# Patient Record
Sex: Female | Born: 1995 | Race: White | Hispanic: No | Marital: Single | Smoking: Current every day smoker
Health system: Southern US, Community
[De-identification: ages and names within clinical notes are randomized; demographics above are authoritative.]

## PROBLEM LIST (undated history)

## (undated) DIAGNOSIS — L309 Dermatitis, unspecified: Secondary | ICD-10-CM

## (undated) DIAGNOSIS — F32A Depression, unspecified: Secondary | ICD-10-CM

## (undated) DIAGNOSIS — F988 Other specified behavioral and emotional disorders with onset usually occurring in childhood and adolescence: Secondary | ICD-10-CM

## (undated) DIAGNOSIS — Z6828 Body mass index (BMI) 28.0-28.9, adult: Secondary | ICD-10-CM

## (undated) DIAGNOSIS — K219 Gastro-esophageal reflux disease without esophagitis: Secondary | ICD-10-CM

## (undated) DIAGNOSIS — T753XXA Motion sickness, initial encounter: Secondary | ICD-10-CM

## (undated) DIAGNOSIS — J45909 Unspecified asthma, uncomplicated: Secondary | ICD-10-CM

## (undated) DIAGNOSIS — F419 Anxiety disorder, unspecified: Secondary | ICD-10-CM

## (undated) HISTORY — PX: NO PAST SURGERIES: SHX2092

## (undated) HISTORY — DX: Depression, unspecified: F32.A

## (undated) HISTORY — DX: Body mass index (BMI) 28.0-28.9, adult: Z68.28

## (undated) HISTORY — PX: TONSILLECTOMY: SUR1361

---
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
## 2017-01-01 ENCOUNTER — Emergency Department
Admission: EM | Admit: 2017-01-01 | Discharge: 2017-01-01 | Disposition: A | Discharge status: Home / Self Care | Payer: Medicaid Other | Attending: Emergency Medicine | Admitting: Emergency Medicine

## 2017-01-01 ENCOUNTER — Encounter: Payer: Self-pay | Admitting: Intensive Care

## 2017-01-01 DIAGNOSIS — F17210 Nicotine dependence, cigarettes, uncomplicated: Secondary | ICD-10-CM | POA: Diagnosis not present

## 2017-01-01 DIAGNOSIS — N3000 Acute cystitis without hematuria: Secondary | ICD-10-CM | POA: Diagnosis not present

## 2017-01-01 DIAGNOSIS — J45909 Unspecified asthma, uncomplicated: Secondary | ICD-10-CM | POA: Diagnosis not present

## 2017-01-01 DIAGNOSIS — F909 Attention-deficit hyperactivity disorder, unspecified type: Secondary | ICD-10-CM | POA: Insufficient documentation

## 2017-01-01 DIAGNOSIS — Z79899 Other long term (current) drug therapy: Secondary | ICD-10-CM | POA: Insufficient documentation

## 2017-01-01 DIAGNOSIS — R350 Frequency of micturition: Secondary | ICD-10-CM | POA: Diagnosis present

## 2017-01-01 DIAGNOSIS — Z91040 Latex allergy status: Secondary | ICD-10-CM | POA: Diagnosis not present

## 2017-01-01 LAB — URINALYSIS, COMPLETE (UACMP) WITH MICROSCOPIC
BILIRUBIN URINE: NEGATIVE
Bacteria, UA: NONE SEEN
Glucose, UA: NEGATIVE mg/dL
HGB URINE DIPSTICK: NEGATIVE
KETONES UR: NEGATIVE mg/dL
Leukocytes, UA: NEGATIVE
Nitrite: NEGATIVE
PROTEIN: NEGATIVE mg/dL
Specific Gravity, Urine: 1.02 (ref 1.005–1.030)
pH: 6 (ref 5.0–8.0)

## 2017-01-01 LAB — POCT PREGNANCY, URINE: PREG TEST UR: NEGATIVE

## 2017-01-01 MED ORDER — CEPHALEXIN 500 MG PO CAPS: 500.0000 mg | ORAL_CAPSULE | Freq: Three times a day (TID) | ORAL | 0 refills | Status: AC

## 2017-01-01 NOTE — ED Triage Notes (Signed)
Patient c/o lower back pain and frequent urination

## 2017-01-01 NOTE — ED Provider Notes (Signed)
[facility] Emergency Department Provider Note  ____________________________________________  Time seen: Approximately 4:47 PM  I have reviewed the triage vital signs and the nursing notes.   HISTORY  Chief Complaint Urinary Tract Infection    HPI Cindy Matthews is a 21 y.o. female presents to the emergency department with increased urinary frequency and bilateral low back pain for approximately 3 days.  Patient reports that she had a urinary tract infection 2 years ago that developed into pyelonephritis.  Patient reports that she did not have dysuria during prior cystitis.  She denies abdominal pain, nausea and hematuria.  No fever.  No alleviating measures have been attempted.   Past Medical History:  Diagnosis Date  . ADD (attention deficit disorder)    No meds since 2016  . Anxiety   . Asthma   . Eczema   . GERD (gastroesophageal reflux disease)   . Motion sickness    [address]    Patient Active Problem List   Diagnosis Date Noted  . Anxiety 01/30/2016  . History of recurrent UTIs 11/13/2014  . ADD (attention deficit disorder) 05/25/2014  . Dermatitis, eczematoid 05/25/2014  . Acid reflux 05/25/2014  . H/O herpes labialis 05/25/2014    Past Surgical History:  Procedure Laterality Date  . NO PAST SURGERIES    . NO PAST SURGERIES    . TONSILLECTOMY AND ADENOIDECTOMY N/A 03/21/2016   Procedure: TONSILLECTOMY AND ADENOIDECTOMY;  Surgeon: Bud Face, MD;  Location: [facility];  Service: ENT;  Laterality: N/A;  Latex sensitivity  adenoids cauterized no tissue sent    Prior to Admission medications   Medication Sig Start Date End Date Taking? Authorizing Provider  busPIRone (BUSPAR) 15 MG tablet Take 1 tablet (15 mg total) by mouth 2 (two) times daily. 07/10/16   Anola Gurney, PA  cephALEXin (KEFLEX) 500 MG capsule Take 1 capsule (500 mg total) by mouth 3 (three) times daily for 10 days. 01/01/17 01/11/17  Orvil Feil,  PA-C  cetirizine (ZYRTEC) 10 MG tablet Take 1 tablet (10 mg total) by mouth daily. 03/02/16   Anola Gurney, PA  PROAIR HFA 108 [phone] Base) MCG/ACT inhaler inhale 2 puffs by mouth every 6 hours if needed for wheezing 04/12/15   Malva Limes, MD  ranitidine (ZANTAC) 75 MG tablet Take 75 mg by mouth 2 (two) times daily as needed for heartburn.    [provider]  triamcinolone cream (KENALOG) 0.1 % Apply 1 application topically 2 (two) times daily. Patient not taking: Reported on 07/10/2016 03/02/16   Anola Gurney, PA    Allergies Cat hair extract; Dog fennel allergy skin test; Dust mite extract; Nitrofurantoin; and Latex  Family History  Problem Relation Age of Onset  . Hypertension Maternal Grandmother   . Diabetes Maternal Grandfather   . Thyroid disease Paternal Grandmother     Social History Social History   Tobacco Use  . Smoking status: Current Some Day Smoker    Packs/day: 0.50    Types: E-cigarettes, Cigarettes  . Smokeless tobacco: Former Neurosurgeon  . Tobacco comment: Smoked 1/2 PPD for about 1 yr. Quit about 1 mo ago.  Now "vapes"  Substance Use Topics  . Alcohol use: No    Alcohol/week: 0.0 oz  . Drug use: Yes    Types: Marijuana     Review of Systems  Constitutional: No fever/chills Eyes: No visual changes. No discharge ENT: No upper respiratory complaints. Cardiovascular: no chest pain. Respiratory: no cough. No SOB. Gastrointestinal:  No abdominal pain.  No nausea, no vomiting.  No diarrhea.  No constipation. Genitourinary: Patient has increased urinary freuqncy. No hematuria Musculoskeletal: Patient has low back pain.  Skin: Negative for rash, abrasions, lacerations, ecchymosis. Neurological: Negative for headaches, focal weakness or numbness.   ____________________________________________   PHYSICAL EXAM:  VITAL SIGNS: ED Triage Vitals  Enc Vitals Group     BP 01/01/17 1540 109/65     Pulse Rate 01/01/17 1540 91     Resp 01/01/17 1540 16      Temp 01/01/17 1540 98.1 F (36.7 C)     Temp Source 01/01/17 1540 Oral     SpO2 01/01/17 1540 98 %     Weight 01/01/17 1540 188 lb (85.3 kg)     Height 01/01/17 1540 5\' 7"  (1.702 m)     Head Circumference --      Peak Flow --      Pain Score 01/01/17 1544 5     Pain Loc --      Pain Edu? --      Excl. in GC? --      Constitutional: Alert and oriented. Well appearing and in no acute distress. Eyes: Conjunctivae are normal. PERRL. EOMI. Head: Atraumatic. Cardiovascular: Normal rate, regular rhythm. Normal S1 and S2.  Good peripheral circulation. Respiratory: Normal respiratory effort without tachypnea or retractions. Lungs CTAB. Good air entry to the bases with no decreased or absent breath sounds. Gastrointestinal: Bowel sounds 4 quadrants. Soft and nontender to palpation. No guarding or rigidity. No palpable masses. No distention. No CVA tenderness. Musculoskeletal: Full range of motion to all extremities. No gross deformities appreciated. Neurologic:  Normal speech and language. No gross focal neurologic deficits are appreciated.  Skin:  Skin is warm, dry and intact. No rash noted. Psychiatric: Mood and affect are normal. Speech and behavior are normal. Patient exhibits appropriate insight and judgement.   ____________________________________________   LABS (all labs ordered are listed, but only abnormal results are displayed)  Labs Reviewed  URINALYSIS, COMPLETE (UACMP) WITH MICROSCOPIC - Abnormal; Notable for the following components:      Result Value   Color, Urine YELLOW (*)    APPearance CLEAR (*)    Squamous Epithelial / LPF 0-5 (*)    All other components within normal limits  URINE CULTURE  POC URINE PREG, ED  POCT PREGNANCY, URINE   ____________________________________________  EKG   ____________________________________________  RADIOLOGY   No results found.  ____________________________________________    PROCEDURES  Procedure(s)  performed:    Procedures    Medications - No data to display   ____________________________________________   INITIAL IMPRESSION / ASSESSMENT AND PLAN / ED COURSE  Pertinent labs & imaging results that were available during my care of the patient were reviewed by me and considered in my medical decision making (see chart for details).  Review of the [place] CSRS was performed in accordance of the [facility] prior to dispensing any controlled drugs.     Assessment and plan Acute cystitis Patient presents to the emergency department with increased urinary frequency and bilateral low back pain for approximately 3 days.  Urinalysis was noncontributory for acute cystitis.  Urine culture was ordered and patient was treated empirically with Keflex.  She was advised to return to the emergency department for new or worsening symptoms.  Vital signs are reassuring prior to discharge.  All patient questions were answered.    ____________________________________________  FINAL CLINICAL IMPRESSION(S) / ED DIAGNOSES  Final diagnoses:  Acute cystitis without  hematuria      NEW MEDICATIONS STARTED DURING THIS VISIT:  ED Discharge Orders        Ordered    cephALEXin (KEFLEX) 500 MG capsule  3 times daily     01/01/17 1643          This chart was dictated using voice recognition software/Dragon. Despite best efforts to proofread, errors can occur which can change the meaning. Any change was purely unintentional.    Orvil FeilWoods, Jaclyn M, PA-C 01/01/17 1650    Minna AntisPaduchowski, Kevin, MD 01/01/17 2330

## 2017-01-03 LAB — URINE CULTURE: Culture: NO GROWTH

## 2017-05-14 ENCOUNTER — Encounter: Payer: Self-pay | Admitting: Family Medicine

## 2017-05-14 ENCOUNTER — Telehealth: Payer: Self-pay | Admitting: Family Medicine

## 2017-05-14 ENCOUNTER — Ambulatory Visit (INDEPENDENT_AMBULATORY_CARE_PROVIDER_SITE_OTHER): Payer: Medicaid Other | Admitting: Family Medicine

## 2017-05-14 ENCOUNTER — Other Ambulatory Visit: Payer: Self-pay | Admitting: Family Medicine

## 2017-05-14 VITALS — BP 112/72 | HR 105 | Temp 98.0°F | Resp 14 | Ht 67.5 in | Wt 170.8 lb

## 2017-05-14 DIAGNOSIS — F4322 Adjustment disorder with anxiety: Secondary | ICD-10-CM | POA: Diagnosis not present

## 2017-05-14 DIAGNOSIS — Z113 Encounter for screening for infections with a predominantly sexual mode of transmission: Secondary | ICD-10-CM

## 2017-05-14 LAB — POCT URINE PREGNANCY: PREG TEST UR: NEGATIVE

## 2017-05-14 MED ORDER — BUSPIRONE HCL 15 MG PO TABS: ORAL_TABLET | ORAL | 1 refills | Status: DC

## 2017-05-14 NOTE — Progress Notes (Signed)
  Subjective:     Patient ID: Cindy Matthews, female   DOB: 09-02-1995, 22 y.o.   MRN: [id number] Chief Complaint  Patient presents with  . Contraception    wants to start the pill, she states is having long heavy periods with blood clots  . SEXUALLY TRANSMITTED DISEASE    wants to be checked, did have a green discharged, with odor   HPI States she broke up with her latest boyfriend 2 weeks ago": My period came sooner than usual (4/22) and is lasting longer." Reports she has douched on one occasion. Intercourse has been unprotected. Reports anxiety and wishes to get back on medication again. Last responded to buspirone.  Review of Systems     Objective:   Physical Exam  Constitutional: She appears well-developed and well-nourished. No distress.       Assessment:    1. Screen for STD (sexually transmitted disease) - POCT urine pregnancy - Chlamydia/Gonococcus/Trichomonas, NAA - HIV antibody - RPR  2. Adjustment disorder with anxious mood - busPIRone (BUSPAR) 15 MG tablet; Start at 1/2 pill twice daily. If not helping after a week may go up to a whole pill twice daily  Dispense: 30 tablet; Refill: 1    Plan:    Further f/u pending lab results and in two weeks

## 2017-05-14 NOTE — Telephone Encounter (Signed)
Pharmacy notified.

## 2017-05-14 NOTE — Patient Instructions (Signed)
We will call you with the lab results. 

## 2017-05-14 NOTE — Telephone Encounter (Signed)
I did review her pharmacy while she was here, but at the time she did not know which one she wanted to use?

## 2017-05-14 NOTE — Telephone Encounter (Signed)
Call and cancel the buspirone rx at [facility]

## 2017-05-14 NOTE — Telephone Encounter (Signed)
Pt stated when she was in for her OV this morning she forgot to change her pharmacy to [facility]. Pt is requesting that the Rx for busPIRone (BUSPAR) 15 MG tablet be sent to [facility]. I have updated pt's preferred pharmacy. Please advise. Thanks TNP

## 2017-05-14 NOTE — Telephone Encounter (Signed)
Pt also notified about orders for blood draw

## 2017-05-15 ENCOUNTER — Telehealth: Payer: Self-pay | Admitting: Family Medicine

## 2017-05-15 NOTE — Telephone Encounter (Signed)
Not my patient and I have never seen her.

## 2017-05-15 NOTE — Telephone Encounter (Signed)
Pt was in yesterday and thought a prescription for birth control was going to be sent in  She said she had to get a pregnancy test before it was sent in and she did the test right before she left  Pt's call back is [phone]  [facility].  [

## 2017-05-15 NOTE — Telephone Encounter (Signed)
Patient seen the other day in office by you please review her chart and advise. KW

## 2017-05-16 LAB — CHLAMYDIA/GONOCOCCUS/TRICHOMONAS, NAA
CHLAMYDIA BY NAA: POSITIVE — AB
GONOCOCCUS BY NAA: NEGATIVE
TRICH VAG BY NAA: NEGATIVE

## 2017-05-16 NOTE — Telephone Encounter (Signed)
We were working on evaluating her vaginal discharge first.

## 2017-05-16 NOTE — Telephone Encounter (Signed)
Left message with mother for patient to return call. KW

## 2017-05-16 NOTE — Telephone Encounter (Signed)
Patient states that vaginal discharge wasn't a big issue for her because she can not tell if she is having a discharge because of the vaginal bleeding. Patient states that she has been on her monthly cycle for the past 2 weeks and was hoping to start birthcontrol to stop bleeding ( urine HCG is negative). Please advise. KW

## 2017-05-16 NOTE — Telephone Encounter (Signed)
Will need to review her STD screen first which should be available in the next 24 hours.

## 2017-05-17 ENCOUNTER — Telehealth: Payer: Self-pay

## 2017-05-17 ENCOUNTER — Other Ambulatory Visit: Payer: Self-pay | Admitting: Family Medicine

## 2017-05-17 MED ORDER — NORETHINDRONE ACET-ETHINYL EST 1-20 MG-MCG PO TABS
1.0000 | ORAL_TABLET | Freq: Every day | ORAL | 11 refills | Status: DC
Start: 2017-05-17 — End: 2019-03-10

## 2017-05-17 MED ORDER — AZITHROMYCIN 500 MG PO TABS: ORAL_TABLET | ORAL | 0 refills | Status: DC

## 2017-05-17 NOTE — Telephone Encounter (Signed)
[id number] 

## 2017-05-17 NOTE — Telephone Encounter (Signed)
-----   Message from Anola Gurney, [place] sent at 05/17/2017  7:59 AM EDT ----- You have an STD. I have sent in a one time dose of medication to your pharmacy as well as prescription for birth control pills. Partner needs to be treated as well. Health Department will be notified (please fill out notification card)

## 2017-05-17 NOTE — Telephone Encounter (Signed)
Patient advised, form being faxed to health department. KW

## 2017-05-23 ENCOUNTER — Ambulatory Visit: Payer: Medicaid Other | Admitting: Family Medicine

## 2017-05-27 ENCOUNTER — Ambulatory Visit: Payer: Self-pay | Admitting: Family Medicine

## 2017-05-28 ENCOUNTER — Ambulatory Visit: Payer: Self-pay | Admitting: Family Medicine

## 2017-06-14 ENCOUNTER — Other Ambulatory Visit: Payer: Self-pay | Admitting: Family Medicine

## 2017-06-14 DIAGNOSIS — F4322 Adjustment disorder with anxiety: Secondary | ICD-10-CM

## 2017-07-04 ENCOUNTER — Ambulatory Visit (INDEPENDENT_AMBULATORY_CARE_PROVIDER_SITE_OTHER): Payer: Medicaid Other | Admitting: Family Medicine

## 2017-07-04 ENCOUNTER — Encounter: Payer: Self-pay | Admitting: Family Medicine

## 2017-07-04 VITALS — BP 110/76 | HR 95 | Temp 98.0°F | Resp 16 | Wt 166.8 lb

## 2017-07-04 DIAGNOSIS — Z113 Encounter for screening for infections with a predominantly sexual mode of transmission: Secondary | ICD-10-CM

## 2017-07-04 DIAGNOSIS — N912 Amenorrhea, unspecified: Secondary | ICD-10-CM

## 2017-07-04 DIAGNOSIS — S63502A Unspecified sprain of left wrist, initial encounter: Secondary | ICD-10-CM | POA: Diagnosis not present

## 2017-07-04 LAB — POCT URINE PREGNANCY: Preg Test, Ur: NEGATIVE

## 2017-07-04 NOTE — Patient Instructions (Addendum)
Discussed use of Aleve or ibuprofen. Consider wearing a cock up splint to keep from reinjuring your wrist. Stop Buspirone and let me know if that helps your irritability improve.

## 2017-07-04 NOTE — Progress Notes (Signed)
  Subjective:     Patient ID: Cindy BeckmannAshlyn N Matthews, female   DOB: [birth year], 22 y.o.   MRN: [id number] Chief Complaint  Patient presents with  . Wrist Pain    Patient comes in office today with concerns of left wrist pain that began 3 days ago, patient descibes pain as shooting when rotating wrist  . Mood Swings    Patient would like to discuss birth control, she states that since she was started medication she has noticed that she has bene more irritable and having emotional mood swings.    HPI She is accompanied by her new boyfriend, Cindy Matthews. Reports that they are using protection during intercourse States she is compliant with BCPs and is taking the 21 day pack continuously so is not having menses. States she wants ot recheck STD screen. Reports increased irritability at times. Remains on buspirone. Also opened a jar with her left hand prior to onset of her wrist pain.  Review of Systems     Objective:   Physical Exam  Constitutional: She appears well-developed and well-nourished. No distress.  Musculoskeletal:  Left wrist mildly tender on the radial side. Ligaments stable. WF/WE 5/5       Assessment:    1. Screen for STD (sexually transmitted disease) - Chlamydia/Gonococcus/Trichomonas, NAA - HIV antibody - RPR  2. Amenorrhea - POCT urine pregnancy  3. Wrist sprain, left, initial encounter    Plan:    Discussed use of nsaid's. Will stop buspirone for possible adverse side effect of emotional lability. Further f/u pending lab work.

## 2017-07-05 ENCOUNTER — Telehealth: Payer: Self-pay

## 2017-07-05 LAB — RPR: RPR Ser Ql: NONREACTIVE

## 2017-07-05 LAB — CHLAMYDIA/GONOCOCCUS/TRICHOMONAS, NAA
CHLAMYDIA BY NAA: NEGATIVE
GONOCOCCUS BY NAA: NEGATIVE
Trich vag by NAA: NEGATIVE

## 2017-07-05 LAB — HIV ANTIBODY (ROUTINE TESTING W REFLEX): HIV Screen 4th Generation wRfx: NONREACTIVE

## 2017-07-05 NOTE — Telephone Encounter (Signed)
Patient advised.KW 

## 2017-07-05 NOTE — Telephone Encounter (Signed)
Pt returned call. CB# [phone]. Please advise. Thanks TNP

## 2017-07-05 NOTE — Telephone Encounter (Signed)
-----   Message from Anola Gurneyobert Chauvin, [place] sent at 07/05/2017  7:23 AM EDT ----- Negative for HIV and syphilis.Other tests pending.

## 2017-07-05 NOTE — Telephone Encounter (Signed)
[id number] 

## 2017-07-08 ENCOUNTER — Telehealth: Payer: Self-pay

## 2017-07-08 NOTE — Telephone Encounter (Signed)
Patient advised.KW 

## 2017-07-08 NOTE — Telephone Encounter (Signed)
Pt returned call ° °teri °

## 2017-07-08 NOTE — Telephone Encounter (Signed)
-----   Message from Anola Gurneyobert Chauvin, [place] sent at 07/05/2017  4:56 PM EDT ----- Urine STD tests negative

## 2017-07-08 NOTE — Telephone Encounter (Signed)
[id number] 

## 2017-08-27 ENCOUNTER — Other Ambulatory Visit: Payer: Self-pay | Admitting: Family Medicine

## 2018-05-20 ENCOUNTER — Encounter: Payer: Medicaid Other | Admitting: Obstetrics and Gynecology

## 2018-06-19 ENCOUNTER — Encounter: Payer: Self-pay | Admitting: Emergency Medicine

## 2018-06-19 ENCOUNTER — Ambulatory Visit
Admission: EM | Admit: 2018-06-19 | Discharge: 2018-06-19 | Disposition: A | Discharge status: Home / Self Care | Payer: Self-pay | Attending: Emergency Medicine | Admitting: Emergency Medicine

## 2018-06-19 ENCOUNTER — Other Ambulatory Visit: Payer: Self-pay

## 2018-06-19 DIAGNOSIS — B373 Candidiasis of vulva and vagina: Secondary | ICD-10-CM

## 2018-06-19 DIAGNOSIS — B3731 Acute candidiasis of vulva and vagina: Secondary | ICD-10-CM

## 2018-06-19 DIAGNOSIS — Z3202 Encounter for pregnancy test, result negative: Secondary | ICD-10-CM

## 2018-06-19 LAB — URINALYSIS, COMPLETE (UACMP) WITH MICROSCOPIC
Bacteria, UA: NONE SEEN
Bilirubin Urine: NEGATIVE
Glucose, UA: NEGATIVE mg/dL
Hgb urine dipstick: NEGATIVE
Ketones, ur: NEGATIVE mg/dL
Leukocytes,Ua: NEGATIVE
Nitrite: NEGATIVE
Protein, ur: NEGATIVE mg/dL
RBC / HPF: NONE SEEN RBC/hpf (ref 0–5)
Specific Gravity, Urine: 1.02 (ref 1.005–1.030)
WBC, UA: NONE SEEN WBC/hpf (ref 0–5)
pH: 7.5 (ref 5.0–8.0)

## 2018-06-19 LAB — HCG, QUANTITATIVE, PREGNANCY: hCG, Beta Chain, Quant, S: <1 m[IU]/mL (ref <5)

## 2018-06-19 LAB — WET PREP, GENITAL
Clue Cells Wet Prep HPF POC: NONE SEEN
Sperm: NONE SEEN
Trich, Wet Prep: NONE SEEN

## 2018-06-19 LAB — PREGNANCY, URINE: Preg Test, Ur: NEGATIVE

## 2018-06-19 NOTE — Discharge Instructions (Signed)
Take medication as prescribed. Rest. Drink plenty of fluids. Monitor.  ° °Follow up with your primary care physician this week as needed. Return to Urgent care for new or worsening concerns.  ° °

## 2018-06-19 NOTE — ED Triage Notes (Signed)
Pt states that she was taking an in home pregnancy test because she is 3 days late on her menstrual cycle and noticed "particles" floating around in her urine and has h/o UTI's and says this has happened with them in the past. She also has noticed a heavier than normal discharge but did not have color to it. Her home pernancy test was negative but she states this same thing happened when she was pregnant about 3 months ago and miscarried.

## 2018-06-19 NOTE — ED Provider Notes (Signed)
[facility] ____________________________________________  Time seen: Approximately 12:05 PM  I have reviewed the triage vital signs and the nursing notes.   HISTORY  Chief Complaint Vaginal Discharge and Amenorrhea   HPI Cindy Matthews is a 23 y.o. female presenting for evaluation of possible urinary infection and requested pregnancy test.  Patient states she noticed some specks in her urine and her urine looked cloudy, stating she has had this in the past with UTIs.  Occasional urinary frequency.  No burning with urination.  Also reports she has been having some vaginal discharge, stating the vaginal discharge is nonodorous and clear to whitish but somewhat thick.  Denies vaginal bleeding or spotting.  Some low back aching mild.  Denies abdominal pain.  States sexually active with the same partner.  Denies concerns of STDs and declines STD testing.  Continues to eat and drink well.  No fevers or recent sickness.  Did have a miscarriage in March of this year.  States has been having regular periods since, and states that her current period is 2 days late, first day of last menstrual was 5/6.  Patient's last menstrual period was 05/21/2018 (approximate).  Malva Limes, MD:PCP   Past Medical History:  Diagnosis Date  . ADD (attention deficit disorder)    No meds since 2016  . Anxiety   . Asthma   . Eczema   . GERD (gastroesophageal reflux disease)   . Motion sickness    [address]    Patient Active Problem List   Diagnosis Date Noted  . Anxiety 01/30/2016  . History of recurrent UTIs 11/13/2014  . ADD (attention deficit disorder) 05/25/2014  . Dermatitis, eczematoid 05/25/2014  . Acid reflux 05/25/2014  . H/O herpes labialis 05/25/2014    Past Surgical History:  Procedure Laterality Date  . NO PAST SURGERIES    . NO PAST SURGERIES    . TONSILLECTOMY AND ADENOIDECTOMY N/A 03/21/2016   Procedure: TONSILLECTOMY AND ADENOIDECTOMY;  Surgeon: Bud Face, MD;  Location: [facility];  Service: ENT;  Laterality: N/A;  Latex sensitivity  adenoids cauterized no tissue sent     No current facility-administered medications for this encounter.   Current Outpatient Medications:  .  cetirizine (ZYRTEC) 10 MG tablet, Take 1 tablet (10 mg total) by mouth daily., Disp: 30 tablet, Rfl: 5 .  norethindrone-ethinyl estradiol (MICROGESTIN,JUNEL,LOESTRIN) 1-20 MG-MCG tablet, Take 1 tablet by mouth daily., Disp: 1 Package, Rfl: 11 .  PROAIR HFA 108 (90 Base) MCG/ACT inhaler, inhale 2 puffs by mouth every 6 hours if needed for wheezing, Disp: 8.5 Inhaler, Rfl: 5 .  ranitidine (ZANTAC) 75 MG tablet, Take 75 mg by mouth 2 (two) times daily as needed for heartburn., Disp: , Rfl:  .  triamcinolone cream (KENALOG) 0.1 %, Apply 1 application topically 2 (two) times daily., Disp: 453.6 g, Rfl: 0  Allergies Cat hair extract; Dog fennel allergy skin test; Dust mite extract; Nitrofurantoin; and Latex  Family History  Problem Relation Age of Onset  . Hypertension Maternal Grandmother   . Diabetes Maternal Grandfather   . Thyroid disease Paternal Grandmother     Social History Social History   Tobacco Use  . Smoking status: Current Some Day Smoker    Packs/day: 0.50    Types: E-cigarettes, Cigarettes  . Smokeless tobacco: Former Neurosurgeon  . Tobacco comment: Smoked 1/2 PPD for about 1 yr. Quit about 1 mo ago.  Now "vapes"  Substance Use Topics  . Alcohol use: No  Alcohol/week: 0.0 standard drinks  . Drug use: Not Currently    Types: Marijuana    Review of Systems Constitutional: No fever ENT: No sore throat. Cardiovascular: Denies chest pain. Respiratory: Denies shortness of breath. Gastrointestinal: No abdominal pain.  No nausea, no vomiting.  No diarrhea.  No constipation. Genitourinary: As above Musculoskeletal: Positive for back pain. Skin: Negative for rash.   ____________________________________________   PHYSICAL EXAM:   VITAL SIGNS: ED Triage Vitals  Enc Vitals Group     BP 06/19/18 1159 114/69     Pulse Rate 06/19/18 1159 97     Resp 06/19/18 1159 18     Temp 06/19/18 1159 98.1 F (36.7 C)     Temp Source 06/19/18 1159 Oral     SpO2 06/19/18 1159 100 %     Weight 06/19/18 1153 160 lb (72.6 kg)     Height 06/19/18 1153  (1.702 m)     Head Circumference --      Peak Flow --      Pain Score 06/19/18 1153 1     Pain Loc --      Pain Edu? --      Excl. in GC? --     Constitutional: Alert and oriented. Well appearing and in no acute distress. ENT      Head: Normocephalic and atraumatic. Cardiovascular: Normal rate, regular rhythm. Grossly normal heart sounds.  Good peripheral circulation. Respiratory: Normal respiratory effort without tachypnea nor retractions. Breath sounds are clear and equal bilaterally. No wheezes, rales, rhonchi. Gastrointestinal: Soft and nontender. No CVA tenderness. Musculoskeletal:No midline cervical, thoracic or lumbar tenderness to palpation.  Neurologic:  Normal speech and language.Speech is normal. No gait instability.  Skin:  Skin is warm, dry and intact. No rash noted. Psychiatric: Mood and affect are normal. Speech and behavior are normal. Patient exhibits appropriate insight and judgment   ___________________________________________   LABS (all labs ordered are listed, but only abnormal results are displayed)  Labs Reviewed  WET PREP, GENITAL - Abnormal; Notable for the following components:      Result Value   Yeast Wet Prep HPF POC PRESENT (*)    WBC, Wet Prep HPF POC RARE (*)    All other components within normal limits  URINE CULTURE  PREGNANCY, URINE  URINALYSIS, COMPLETE (UACMP) WITH MICROSCOPIC  HCG, QUANTITATIVE, PREGNANCY     PROCEDURES Procedures    INITIAL IMPRESSION / ASSESSMENT AND PLAN / ED COURSE  Pertinent labs & imaging results that were available during my care of the patient were reviewed by me and considered in my medical  decision making (see chart for details).  Well-appearing patient.  No acute distress.  Suspect contaminated urine, will culture.  Urine pregnancy negative.  Will evaluate serum hCG.  Patient elected for self wet prep.  Wet prep positive for yeast.  Pending negative serum hCG, serum hCG negative.  Discussed use of Diflucan, patient however states she will use over-the-counter Monistat. Encourage rest, fluids, supportive care and follow-up with OB/GYN. Discussed indication, risks and benefits of medications with patient.  Discussed follow up with Primary care physician this week. Discussed follow up and return parameters including no resolution or any worsening concerns. Patient verbalized understanding and agreed to plan.    ___________________________________________   FINAL CLINICAL IMPRESSION(S) / ED DIAGNOSES  Final diagnoses:  Yeast vaginitis  Pregnancy examination or test, negative result     ED Discharge Orders    None       Note: This  dictation was prepared with Dragon dictation along with smaller phrase technology. Any transcriptional errors that result from this process are unintentional.         Renford DillsMiller, Arriona Prest, NP 06/19/18 1407

## 2018-06-20 LAB — URINE CULTURE: Culture: <10000 — AB

## 2019-03-10 ENCOUNTER — Encounter: Payer: Self-pay | Admitting: Obstetrics and Gynecology

## 2019-03-10 ENCOUNTER — Ambulatory Visit (INDEPENDENT_AMBULATORY_CARE_PROVIDER_SITE_OTHER): Payer: Self-pay | Admitting: Obstetrics and Gynecology

## 2019-03-10 ENCOUNTER — Other Ambulatory Visit: Payer: Self-pay

## 2019-03-10 VITALS — BP 114/76 | Ht 67.0 in | Wt 197.0 lb

## 2019-03-10 DIAGNOSIS — R1909 Other intra-abdominal and pelvic swelling, mass and lump: Secondary | ICD-10-CM

## 2019-03-10 DIAGNOSIS — Z319 Encounter for procreative management, unspecified: Secondary | ICD-10-CM

## 2019-03-10 DIAGNOSIS — Z3189 Encounter for other procreative management: Secondary | ICD-10-CM

## 2019-03-10 DIAGNOSIS — Z8759 Personal history of other complications of pregnancy, childbirth and the puerperium: Secondary | ICD-10-CM

## 2019-03-10 NOTE — Progress Notes (Signed)
Patient ID: Cindy Matthews, female   DOB: [birth year], 24 y.o.   MRN: [id number]  Reason for Consult: Consult (Fertility consult, been trying about a year had pervious miscarriage )   Referred by Birdie Sons, MD  Subjective:     HPI:  Cindy Matthews is a 24 y.o. female.   She presents today for consult regarding infertility.  She reports that she had a miscarriage in March 2020 but since that time has not been able to conceive or get pregnant despite regular intercourse without contraception.  She reports that her partner is 13.  He has fathered no children.  He did have an injury to his scrotum as a child when climbing the fence.  She reports that he has a long history of cigarette and marijuana usage since the age of 61.  Gynecological History Menarche: She reports she first had a period which lasted 2 days at the age of 89 her period did not recur until she was 5.  At the age of 58 she was started on Depo-Provera (for heavy bleeding) which she stopped at the age of 12.  She took an oral contraceptive for about 3 months and then stopped for the last year and a half she has been having regular monthly periods she reports that her periods are generally 28 days apart.  She has 5 days of heavy bleeding and 2 days of light bleeding for a 7-day total of her menstrual cycle.  LMP: 02/10/2019 Describes periods as heavy.  She reports that she passes large clots about the size of a egg.  She has random shooting pain throughout the month and during her menstrual cycle.  She reports that during her period she has a throbbing pelvic pain for 1 or 2 days as well as lower back pain.  She has diarrhea during generally during her menstrual cycle as well.  Last pap smear: Never History of STDs: History of chlamydia 2 years ago.  She reports that she had the infection for about a month and a half before it was treated with antibiotics and improved.  Sexually Active: yes, she reports that her and her  partner have sex 3-4 times a week.  Sometimes they have intercourse 5-6 times a day.  They do not use condoms.  Sometimes they use withdrawal if she is having symptoms of a yeast infection.   She does not have a history of ovarian cyst fibroids or polyps.  She reports that her mom has a history of heavy menstrual periods for which she sometimes misses work.  She does not know if anyone in her family has a history of endometriosis.  She reports that she does commonly get yeast infections which she generally treats with over-the-counter Monistat, she generally does have more than 3 yeast infections a year.   Obstetrical History G1 P0-0-1-0 March 27th 2020-patient had a spontaneous miscarriage at 5 weeks 6 days gestation she reports that she passed the products of conception at home with heavy bleeding.  She suffered 4 months of depression afterwards.   Past Medical History:  Diagnosis Date  . ADD (attention deficit disorder)    No meds since 2016  . Anxiety   . Asthma   . Eczema   . GERD (gastroesophageal reflux disease)   . Motion sickness    curvy [address]   Family History  Problem Relation Age of Onset  . Hypertension Maternal Grandmother   . Diabetes Maternal Grandfather   .  Thyroid disease Paternal Grandmother    Past Surgical History:  Procedure Laterality Date  . NO PAST SURGERIES    . NO PAST SURGERIES    . TONSILLECTOMY AND ADENOIDECTOMY N/A 03/21/2016   Procedure: TONSILLECTOMY AND ADENOIDECTOMY;  Surgeon: Bud Face, MD;  Location: [facility];  Service: ENT;  Laterality: N/A;  Latex sensitivity  adenoids cauterized no tissue sent    Short Social History:  Social History   Tobacco Use  . Smoking status: Former Smoker    Packs/day: 0.50    Types: E-cigarettes, Cigarettes  . Smokeless tobacco: Former Neurosurgeon  . Tobacco comment: Quit 4-5 months ago   Substance Use Topics  . Alcohol use: No    Alcohol/week: 0.0 standard drinks   She reports that she  successfully quit smoking cigarettes 4 to 6 months ago her partner stop smoking cigarettes as well.  She reports that she is a heavy daily marijuana marijuana user.  She reports that she generally only smokes with her partner.  She says that days he is not working they will smoke every hour.  On days that he does not work they will smoke 1-2 bowls of marijuana in the morning and again at night.    Allergies  Allergen Reactions  . Cat Hair Extract Itching    Other reaction(s): Sneezing Watery eyes  . Dog Fennel Allergy Skin Test   . Dust Mite Extract   . Nitrofurantoin Nausea Only  . Latex Rash    Only in mouth at dentist.     Current Outpatient Medications  Medication Sig Dispense Refill  . Prenatal Vit-Fe Fumarate-FA (PNV PRENATAL PLUS MULTIVITAMIN) 27-1 MG TABS Take by mouth.    Marland Kitchen PROAIR HFA 108 (90 Base) MCG/ACT inhaler inhale 2 puffs by mouth every 6 hours if needed for wheezing 8.5 Inhaler 5   No current facility-administered medications for this visit.    Review of Systems  Constitutional: Negative for chills, fatigue, fever and unexpected weight change.  HENT: Negative for trouble swallowing.  Eyes: Negative for loss of vision.  Respiratory: Negative for cough, shortness of breath and wheezing.  Cardiovascular: Negative for chest pain, leg swelling, palpitations and syncope.  GI: Negative for abdominal pain, blood in stool, diarrhea, nausea and vomiting.  GU: Negative for difficulty urinating, dysuria, frequency and hematuria.  Musculoskeletal: Negative for back pain, leg pain and joint pain.  Skin: Negative for rash.  Neurological: Negative for dizziness, headaches, light-headedness, numbness and seizures.  Psychiatric: Negative for behavioral problem, confusion, depressed mood and sleep disturbance.        Objective:  Objective   Vitals:   03/10/19 1333  BP: 114/76  Weight: 197 lb (89.4 kg)  Height: 5\' 7"  (1.702 m)   Body mass index is 30.85 kg/m.  Physical  Exam Vitals and nursing note reviewed.  Constitutional:      Appearance: She is well-developed.  HENT:     Head: Normocephalic and atraumatic.  Eyes:     Pupils: Pupils are equal, round, and reactive to light.  Cardiovascular:     Rate and Rhythm: Normal rate and regular rhythm.  Pulmonary:     Effort: Pulmonary effort is normal. No respiratory distress.  Abdominal:     General: Abdomen is flat. There is no distension.     Palpations: Abdomen is soft.     Tenderness: There is no abdominal tenderness. There is no guarding or rebound.  Genitourinary:    Comments: External: Vulva normal. No lesions noted.  Speculum  examination:  Cervix norma . No blood in the vaginal vault. No discharge.  Bimanual examination: Uterus midline, non-tender, normal in size, shape and contou.  No CMT.  Left adnexal mass noted, tender to palpation on exam, firm. Pelvis not fixed.  Skin:    General: Skin is warm and dry.  Neurological:     Mental Status: She is alert and oriented to person, place, and time.     Deep Tendon Reflexes: Abnormal reflex:   Psychiatric:        Behavior: Behavior normal.        Thought Content: Thought content normal.        Judgment: Judgment normal.        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  Patient is hesitant to be in any labs or evaluation today since she is paying out-of-pocket for this visit.  And does not have insurance.  She was given a list of labs that we would recommend including a TSH prolactin and AMH and a 20-day progesterone as well as a semen analysis.  She was given estimations of the cost and advised to have these collected at a [facility] patient service center where the price can be reduced and cash can be accepted.  Discussed the need for a pelvic ultrasound especially given the left adnexal mass which was seen on today's exam.  Discussed HSG which could be  scheduled to evaluate for tubal patency.  Discussed semen analysis and the cost patient will need to follow-up and let us know if this lab needs to be ordered for her partner.  Given patient's history of painful periods and heavy bleeding discussed the possibility of endometriosis.  Discussed that this is generally diagnosed by laparoscopy which could be considered but does not have to be a first-line evaluation.  Patient not interested in surgery for diagnosis at this time.   Advised patient to start a prenatal vitamin discussed the benefits of initiating this vitamin 2 to 3 months before conception.  Discussed how marijuana can contribute to difficulty with conception for up from a female and female standpoint.  Advised patient to cut back and stop marijuana usage to help improve infertility.  Congratulated patient on discontinuation of tobacco.  More than 45 minutes were spent face to face with the patient in the room with more than 50% of the time spent providing counseling and discussing the plan of management.     Adelene Idler MD [facility], [facility] 03/10/2019 2:39 PM

## 2019-03-10 NOTE — Patient Instructions (Addendum)
TSH- Free T4 Prolactin AMH- ovarian reserve Progesterone- menstrual cycle day 20  Pelvic Ultrasound- strongly recommend with left adnexal mass HSG  Pap Smear and STD testing- Health Department and [facility] program      Female Infertility  Female infertility refers to a woman's inability to get pregnant (conceive) after a year of having sex regularly (or after 6 months in women over age 22) without using birth control. Infertility can also mean that a woman is not able to carry a pregnancy to full term. Both women and men can have fertility problems. What are the causes? This condition may be caused by:  Problems with reproductive organs. Infertility can result if a woman: ? Has an abnormally short cervix or a cervix that does not remain closed during a pregnancy. ? Has a blockage or scarring in the fallopian tubes. ? Has an abnormally shaped uterus. ? Has uterine fibroids. This is a benign mass of tissue or muscle (tumor) that can develop in the uterus. ? Is not ovulating in a regular way.  Certain medical conditions. These may include: ? Polycystic ovary syndrome (PCOS). This is a hormonal disorder that can cause small cysts to grow on the ovaries. This is the most common cause of infertility in women. ? Endometriosis. This is a condition in which the tissue that lines the uterus (endometrium) grows outside of its normal location. ? Cancer and cancer treatments, such as chemotherapy or radiation. ? Premature ovarian failure. This is when ovaries stop producing eggs and hormones before age 34. ? Sexually transmitted diseases, such as chlamydia or gonorrhea. ? Autoimmune disorders. These are disorders in which the body's defense system (immune system) attacks normal, healthy cells. Infertility can be linked to more than one cause. For some women, the cause of infertility is not known (unexplained infertility). What increases the risk?  Age. A woman's fertility declines with age,  especially after her mid-30s.  Being underweight or overweight.  Drinking too much alcohol.  Using drugs such as anabolic steroids, cocaine, and marijuana.  Exercising excessively.  Being exposed to environmental toxins, such as radiation, pesticides, and certain chemicals. What are the signs or symptoms? The main sign of infertility in women is the inability to get pregnant or carry a pregnancy to full term. How is this diagnosed? This condition may be diagnosed by:  Checking whether you are ovulating each month. The tests may include: ? Blood tests to check hormone levels. ? An ultrasound of the ovaries. ? Taking a small tissue that lines the uterus and checking it under a microscope (endometrial biopsy).  Doing additional tests. This is done if ovulation is normal. Tests may include: ? Hysterosalpingography. This X-ray test can show the shape of the uterus and whether the fallopian tubes are open. ? Laparoscopy. This test uses a lighted tube (laparoscope) to look for problems in the fallopian tubes and other organs. ? Transvaginal ultrasound. This imaging test is used to check for abnormalities in the uterus and ovaries. ? Hysteroscopy. This test uses a lighted tube to check for problems in the cervix and the uterus. To be diagnosed with infertility, both partners will have a physical exam. Both partners will also have an extensive medical and sexual history taken. Additional tests may be done. How is this treated? Treatment depends on the cause of infertility. Most cases of infertility in women are treated with medicine or surgery.  Women may take medicine to: ? Correct ovulation problems. ? Treat other health conditions.  Surgery may  be done to: ? Repair damage to the ovaries, fallopian tubes, cervix, or uterus. ? Remove growths from the uterus. ? Remove scar tissue from the uterus, pelvis, or other organs. Assisted reproductive technology (ART) Assisted reproductive  technology (ART) refers to all treatments and procedures that combine eggs and sperm outside the body to try to help a couple conceive. ART is often combined with fertility drugs to stimulate ovulation. Sometimes ART is done using eggs retrieved from another woman's body (donor eggs) or from previously frozen fertilized eggs (embryos). There are different types of ART. These include:  Intrauterine insemination (IUI). A long, thin tube is used to place sperm directly into a woman's uterus. This procedure: ? Is effective for infertility caused by sperm problems, including low sperm count and low motility. ? Can be used in combination with fertility drugs.  In vitro fertilization (IVF). This is done when a woman's fallopian tubes are blocked or when a man has low sperm count. In this procedure: ? Fertility drugs are used to stimulate the ovaries to produce multiple eggs. ? Once mature, these eggs are removed from the body and combined with the sperm to be fertilized. ? The fertilized eggs are then placed into the woman's uterus. Follow these instructions at home:  Take over-the-counter and prescription medicines only as told by your health care provider.  Do not use any products that contain nicotine or tobacco, such as cigarettes and e-cigarettes. If you need help quitting, ask your health care provider.  If you drink alcohol, limit how much you have to 1 drink a day.  Make dietary changes to lose weight or maintain a healthy weight. Work with your health care provider and a dietitian to set a weight-loss goal that is healthy and reasonable for you.  Seek support from a counselor or support group to talk about your concerns related to infertility. Couples counseling may be helpful for you and your partner.  Practice stress reduction techniques that work well for you, such as regular physical activity, meditation, or deep breathing.  Keep all follow-up visits as told by your health care  provider. This is important. Contact a health care provider if you:  Feel that stress is interfering with your life and relationships.  Have side effects from treatments for infertility. Summary  Female infertility refers to a woman's inability to get pregnant (conceive) after a year of having sex regularly (or after 6 months in women over age 22) without using birth control.  To be diagnosed with infertility, both partners will have a physical exam. Both partners will also have an extensive medical and sexual history taken.  Seek support from a counselor or support group to talk about your concerns related to infertility. Couples counseling may be helpful for you and your partner. This information is not intended to replace advice given to you by your health care provider. Make sure you discuss any questions you have with your health care provider. Document Revised: 04/24/2018 Document Reviewed: 12/03/2016 Elsevier Patient Education  [place].

## 2019-05-21 ENCOUNTER — Ambulatory Visit
Admission: EM | Admit: 2019-05-21 | Discharge: 2019-05-21 | Disposition: A | Discharge status: Home / Self Care | Payer: Self-pay | Attending: Family Medicine | Admitting: Family Medicine

## 2019-05-21 ENCOUNTER — Other Ambulatory Visit: Payer: Self-pay

## 2019-05-21 ENCOUNTER — Ambulatory Visit (INDEPENDENT_AMBULATORY_CARE_PROVIDER_SITE_OTHER): Payer: Self-pay

## 2019-05-21 ENCOUNTER — Encounter: Payer: Self-pay | Admitting: Emergency Medicine

## 2019-05-21 DIAGNOSIS — S91332A Puncture wound without foreign body, left foot, initial encounter: Secondary | ICD-10-CM | POA: Insufficient documentation

## 2019-05-21 LAB — PREGNANCY, URINE: Preg Test, Ur: NEGATIVE

## 2019-05-21 MED ORDER — CIPROFLOXACIN HCL 500 MG PO TABS: 500.0000 mg | ORAL_TABLET | Freq: Two times a day (BID) | ORAL | 0 refills | Status: AC

## 2019-05-21 NOTE — Discharge Instructions (Addendum)
Take medication as prescribed. Rest. Drink plenty of fluids. Keep clean. Topical antibiotic ointment. Elevate.  Follow up with your primary care physician this week as needed. Return to Urgent care for new or worsening concerns.

## 2019-05-21 NOTE — ED Triage Notes (Addendum)
Patient states she stepped on a nail yesterday with her left foot. States she did have shoes on. Last Tdap was 08/2015.

## 2019-05-21 NOTE — ED Provider Notes (Signed)
[facility] ____________________________________________  Time seen: Approximately 12:45 PM  I have reviewed the triage vital signs and the nursing notes.   HISTORY  Chief Complaint Puncture Wound (left foot)   HPI Cindy Matthews is a 24 y.o. female present for evaluation of left foot pain after stepping on a nail yesterday.  Patient reports she accidentally stepped on a nail yesterday around 2-3 o'clock while walking to get a bucket for her dogs.  States she was wearing rubber soled shoes.  Has cleaned multiple times.  States does have some soreness.  Stepped on the nail and then immediately retracted foot.  Denies paresthesias, decreased range of motion or other injuries.  Last tetanus immunization 4 years ago.  Denies other aggravating alleviating factors.  No recent fevers.  Denies recent sickness or antibiotic use.  Reports otherwise doing well.  Patient's last menstrual period was 05/13/2019.    Past Medical History:  Diagnosis Date  . ADD (attention deficit disorder)    No meds since 2016  . Anxiety   . Asthma   . Eczema   . GERD (gastroesophageal reflux disease)   . Motion sickness    curvy [address]    Patient Active Problem List   Diagnosis Date Noted  . Anxiety 01/30/2016  . History of recurrent UTIs 11/13/2014  . ADD (attention deficit disorder) 05/25/2014  . Dermatitis, eczematoid 05/25/2014  . Acid reflux 05/25/2014  . H/O herpes labialis 05/25/2014    Past Surgical History:  Procedure Laterality Date  . NO PAST SURGERIES    . NO PAST SURGERIES    . TONSILLECTOMY    . TONSILLECTOMY AND ADENOIDECTOMY N/A 03/21/2016   Procedure: TONSILLECTOMY AND ADENOIDECTOMY;  Surgeon: Carloyn Manner, MD;  Location: [facility];  Service: ENT;  Laterality: N/A;  Latex sensitivity  adenoids cauterized no tissue sent     No current facility-administered medications for this encounter.  Current Outpatient Medications:  .  ciprofloxacin (CIPRO)  500 MG tablet, Take 1 tablet (500 mg total) by mouth every 12 (twelve) hours for 5 days., Disp: 10 tablet, Rfl: 0 .  Prenatal Vit-Fe Fumarate-FA (PNV PRENATAL PLUS MULTIVITAMIN) 27-1 MG TABS, Take by mouth., Disp: , Rfl:  .  PROAIR HFA 108 (90 Base) MCG/ACT inhaler, inhale 2 puffs by mouth every 6 hours if needed for wheezing, Disp: 8.5 Inhaler, Rfl: 5  Allergies Cat hair extract, Dog fennel allergy skin test, Dust mite extract, Nitrofurantoin, and Latex  Family History  Problem Relation Age of Onset  . Hypertension Maternal Grandmother   . Diabetes Maternal Grandfather   . Thyroid disease Paternal Grandmother     Social History Social History   Tobacco Use  . Smoking status: Former Smoker    Packs/day: 0.50    Types: E-cigarettes, Cigarettes  . Smokeless tobacco: Former Systems developer  . Tobacco comment: Quit 4-5 months ago   Substance Use Topics  . Alcohol use: No    Alcohol/week: 0.0 standard drinks  . Drug use: Not Currently    Types: Marijuana    Review of Systems Constitutional: No fever ENT: No sore throat. Cardiovascular: Denies chest pain. Respiratory: Denies shortness of breath. Musculoskeletal: Positive left foot pain. Skin: Positive skin changes.  ____________________________________________   PHYSICAL EXAM:  VITAL SIGNS: ED Triage Vitals  Enc Vitals Group     BP 05/21/19 1130 120/70     Pulse Rate 05/21/19 1130 80     Resp 05/21/19 1130 18     Temp 05/21/19 1130 98.3  F (36.8 C)     Temp Source 05/21/19 1130 Oral     SpO2 05/21/19 1130 99 %     Weight 05/21/19 1129 192 lb (87.1 kg)     Height 05/21/19 1129 5\' 8"  (1.727 m)     Head Circumference --      Peak Flow --      Pain Score 05/21/19 1128 1     Pain Loc --      Pain Edu? --      Excl. in GC? --     Constitutional: Alert and oriented. Well appearing and in no acute distress. Eyes: Conjunctivae are normal.  ENT      Head: Normocephalic and atraumatic. Cardiovascular:   Good peripheral  circulation. Musculoskeletal: Steady gait. Bilateral pedal pulses equal and easily palpated.  except: Left plantar foot anteriorly to he will puncture mark present with minimal surrounding erythema, no foreign body noted, no point bony tenderness, full range of motion present, normal sensation, and cap refill. Neurologic:  Normal speech and language. Speech is normal. No gait instability.  Skin:  Skin is warm, dry. As above.  Psychiatric: Mood and affect are normal. Speech and behavior are normal. Patient exhibits appropriate insight and judgment   ___________________________________________   LABS (all labs ordered are listed, but only abnormal results are displayed)  Labs Reviewed  PREGNANCY, URINE   ____________________________________________  RADIOLOGY  DG Foot 2 Views Left  Result Date: 05/21/2019 CLINICAL DATA:  Stepped on rusty nail yesterday. EXAM: LEFT FOOT - 2 VIEW COMPARISON:  None. FINDINGS: There is no evidence of fracture or dislocation. There is no evidence of arthropathy or other focal bone abnormality. Soft tissues are unremarkable. No radiopaque foreign body is noted. IMPRESSION: Negative. Electronically Signed   By: 07/21/2019 M.D.   On: 05/21/2019 12:12   ____________________________________________   PROCEDURES Procedures    INITIAL IMPRESSION / ASSESSMENT AND PLAN / ED COURSE  Pertinent labs & imaging results that were available during my care of the patient were reviewed by me and considered in my medical decision making (see chart for details).  Well-appearing patient.  No acute distress.  Left foot stepped on nail.  Tetanus immunization is up-to-date.  X-ray as above, radiologist negative no radiopaque foreign body and no bony abnormality.  As went through rubber soled shoe will treat with Cipro. Rest. Ice, keep clean.  Topical antibiotics.  Discussed her follow-up and return parameters.Discussed indication, risks and benefits of medications with  patient.   Discussed follow up with Primary care physician this week. Discussed follow up and return parameters including no resolution or any worsening concerns. Patient verbalized understanding and agreed to plan.   ____________________________________________   FINAL CLINICAL IMPRESSION(S) / ED DIAGNOSES  Final diagnoses:  Puncture wound of left foot, initial encounter     ED Discharge Orders         Ordered    ciprofloxacin (CIPRO) 500 MG tablet  Every 12 hours     05/21/19 1217           Note: This dictation was prepared with Dragon dictation along with smaller phrase technology. Any transcriptional errors that result from this process are unintentional.         1218, NP 05/21/19 1253

## 2019-06-12 ENCOUNTER — Telehealth: Payer: Self-pay | Admitting: Obstetrics and Gynecology

## 2019-06-12 NOTE — Telephone Encounter (Signed)
Called and left generic message for patient to call back. Patient has an ultrasound still ordered and we were calling to see if she wanted to schedule this appointment. Patient has no insurance on file so for this appointment to be scheduled patient would have self pay cost at the time of visit of [phone].

## 2019-08-05 NOTE — Telephone Encounter (Signed)
Voicemail not set up on able to leave message to see if she would like to like schedule this ultrasound order in February.

## 2020-03-19 ENCOUNTER — Encounter: Payer: Self-pay | Admitting: Physician Assistant

## 2020-10-08 ENCOUNTER — Other Ambulatory Visit: Payer: Self-pay

## 2020-10-08 ENCOUNTER — Emergency Department: Payer: Self-pay

## 2020-10-08 ENCOUNTER — Emergency Department
Admission: EM | Admit: 2020-10-08 | Discharge: 2020-10-08 | Disposition: A | Discharge status: Home / Self Care | Payer: Self-pay | Attending: Emergency Medicine | Admitting: Emergency Medicine

## 2020-10-08 DIAGNOSIS — R0602 Shortness of breath: Secondary | ICD-10-CM | POA: Insufficient documentation

## 2020-10-08 DIAGNOSIS — J45909 Unspecified asthma, uncomplicated: Secondary | ICD-10-CM | POA: Insufficient documentation

## 2020-10-08 DIAGNOSIS — Z5321 Procedure and treatment not carried out due to patient leaving prior to being seen by health care provider: Secondary | ICD-10-CM | POA: Insufficient documentation

## 2020-10-08 LAB — CBC
HCT: 35.6 % — ABNORMAL LOW (ref 36.0–46.0)
Hemoglobin: 11.2 g/dL — ABNORMAL LOW (ref 12.0–15.0)
MCH: 24.9 pg — ABNORMAL LOW (ref 26.0–34.0)
MCHC: 31.5 g/dL (ref 30.0–36.0)
MCV: 79.1 fL — ABNORMAL LOW (ref 80.0–100.0)
Platelets: 278 10*3/uL (ref 150–400)
RBC: 4.5 MIL/uL (ref 3.87–5.11)
RDW: 15.7 % — ABNORMAL HIGH (ref 11.5–15.5)
WBC: 6.2 10*3/uL (ref 4.0–10.5)
nRBC: 0 % (ref 0.0–0.2)

## 2020-10-08 LAB — TROPONIN I (HIGH SENSITIVITY): Troponin I (High Sensitivity): <2 ng/L (ref <18)

## 2020-10-08 LAB — BASIC METABOLIC PANEL
Anion gap: 7 (ref 5–15)
BUN: 8 mg/dL (ref 6–20)
CO2: 24 mmol/L (ref 22–32)
Calcium: 9.2 mg/dL (ref 8.9–10.3)
Chloride: 109 mmol/L (ref 98–111)
Creatinine, Ser: 0.67 mg/dL (ref 0.44–1.00)
GFR, Estimated: >60 mL/min (ref >60)
Glucose, Bld: 107 mg/dL — ABNORMAL HIGH (ref 70–99)
Potassium: 3.6 mmol/L (ref 3.5–5.1)
Sodium: 140 mmol/L (ref 135–145)

## 2020-10-08 LAB — POC URINE PREG, ED: Preg Test, Ur: NEGATIVE

## 2020-10-08 NOTE — ED Provider Notes (Signed)
Emergency Medicine Provider Triage Evaluation Note  Cindy Matthews, a 25 y.o. female  was evaluated in triage.  Pt complains of SOB and central chest tightness. She denies frank chest pain, nausea, or diaphoresis. She notes onset yesterday after getting out of the shower.   Review of Systems  Positive: CP, SOB Negative: NVD  Physical Exam  BP 110/62 (BP Location: Left Arm)   Pulse 63   Temp 98.6 F (37 C) (Oral)   Resp 18   Ht 5\' 7"  (1.702 m)   Wt 96.2 kg   SpO2 100%   BMI 33.20 kg/m  Gen:   Awake, no distress  NAD Resp:  Normal effort CTA MSK:   Moves extremities without difficulty  Other:  CVS: RRR  Medical Decision Making  Medically screening exam initiated at 3:21 PM.  Appropriate orders placed.  was informed that the remainder of the evaluation will be completed by another provider, this initial triage assessment does not replace that evaluation, and the importance of remaining in the ED until their evaluation is complete.  Patient with ED evaluation of chest pain and SOB.    Cindy Beckmann, PA-C 10/08/20 1523    10/10/20, MD 10/08/20 [phone]

## 2020-10-08 NOTE — ED Triage Notes (Signed)
Pt states that yesterday when she got out of the shower she had SHOB, denies CP- pt does have a hx of asthma, but states that this feels different from her asthma- pt tried her inhaler but it did not help- pt states she has had episodes of SHOB today- pt denies cough, congestion, and fevers

## 2020-11-17 ENCOUNTER — Other Ambulatory Visit: Payer: Self-pay

## 2020-11-17 ENCOUNTER — Encounter: Payer: Self-pay | Admitting: Licensed Clinical Social Worker

## 2020-11-17 ENCOUNTER — Ambulatory Visit
Admission: EM | Admit: 2020-11-17 | Discharge: 2020-11-17 | Disposition: A | Discharge status: Home / Self Care | Payer: Self-pay | Attending: Physician Assistant | Admitting: Physician Assistant

## 2020-11-17 DIAGNOSIS — N3000 Acute cystitis without hematuria: Secondary | ICD-10-CM | POA: Insufficient documentation

## 2020-11-17 LAB — URINALYSIS, COMPLETE (UACMP) WITH MICROSCOPIC
Bilirubin Urine: NEGATIVE
Glucose, UA: NEGATIVE mg/dL
Ketones, ur: NEGATIVE mg/dL
Nitrite: NEGATIVE
Protein, ur: NEGATIVE mg/dL
Specific Gravity, Urine: 1.025 (ref 1.005–1.030)
WBC, UA: >50 WBC/hpf (ref 0–5)
pH: 7 (ref 5.0–8.0)

## 2020-11-17 MED ORDER — SULFAMETHOXAZOLE-TRIMETHOPRIM 800-160 MG PO TABS: 1.0000 | ORAL_TABLET | Freq: Two times a day (BID) | ORAL | 0 refills | Status: AC

## 2020-11-17 NOTE — Discharge Instructions (Addendum)
Your urinalysis today showed bacteria and Cindy Matthews blood cells therefore you will be treated for urinary infection  We will send urine to the lab to see exactly which bacteria is present to make sure you are on the correct antibiotic, if your medication needs to be changed you will be notified  Take Bactrim twice a day for the next 3 days  You may follow-up at urgent care as needed if symptoms do not improve after medication use

## 2020-11-17 NOTE — ED Provider Notes (Signed)
[facility]    CSN: [id number] Arrival date & time: 11/17/20  1307      History   Chief Complaint Chief Complaint  Patient presents with   Dysuria    HPI SUESAN FOLZ is a 25 y.o. female.   Patient presents with dysuria and lower abdominal pressure for 1 week.  Endorses urinary frequency began yesterday.  Denies hematuria, flank pain, vaginal itching or irritation, vaginal discharge.  Has not attempted treatment of symptoms.  History of genital herpes, recurrent UTIs, anxiety, dermatitis, acid reflux, asthma, anxiety.  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    Past Surgical History:  Procedure Laterality Date   NO PAST SURGERIES     NO PAST SURGERIES     TONSILLECTOMY     TONSILLECTOMY AND ADENOIDECTOMY N/A 03/21/2016   Procedure: TONSILLECTOMY AND ADENOIDECTOMY;  Surgeon: Carloyn Manner, MD;  Location: [place];  Service: ENT;  Laterality: N/A;  Latex sensitivity  adenoids cauterized no tissue sent    OB History     Gravida  1   Para      Term      Preterm      AB  1   Living         SAB  1   IAB      Ectopic      Multiple      Live Births               Home Medications    Prior to Admission medications   Medication Sig Start Date End Date Taking? Authorizing Provider  PROAIR HFA [phone] Base) MCG/ACT inhaler inhale 2 puffs by mouth every 6 hours if needed for wheezing 04/12/15  Yes Fisher, Kirstie Peri, MD  sulfamethoxazole-trimethoprim (BACTRIM DS) 800-160 MG tablet  Take 1 tablet by mouth 2 (two) times daily for 3 days. 11/17/20 11/20/20 Yes Shareta Fishbaugh, Leitha Schuller, NP  Prenatal Vit-Fe Fumarate-FA (PNV PRENATAL PLUS MULTIVITAMIN) 27-1 MG TABS Take by mouth.    [provider]    Family History Family History  Problem Relation Age of Onset   Hypertension Maternal Grandmother    Diabetes Maternal Grandfather    Thyroid disease Paternal Grandmother     Social History Social History   Tobacco Use   Smoking status: Former    Packs/day: 0.50    Types: E-cigarettes, Cigarettes   Smokeless tobacco: Former   Tobacco comments:    Quit 4-5 months ago   Scientific laboratory technician Use: Every day  Substance Use Topics   Alcohol use: No    Alcohol/week: 0.0 standard drinks   Drug use: Not Currently    Types: Marijuana     Allergies   Cat hair extract, Dog fennel allergy skin test, Dust mite extract, Nitrofurantoin, and Latex   Review of Systems Review of Systems  Constitutional: Negative.   HENT: Negative.    Gastrointestinal:  Positive for abdominal pain. Negative  for abdominal distention, anal bleeding, blood in stool, constipation, diarrhea, nausea, rectal pain and vomiting.  Genitourinary:  Positive for dysuria and frequency. Negative for decreased urine volume, difficulty urinating, dyspareunia, enuresis, flank pain, genital sores, hematuria, menstrual problem, pelvic pain, urgency, vaginal bleeding, vaginal discharge and vaginal pain.  Skin: Negative.     Physical Exam Triage Vital Signs ED Triage Vitals  Enc Vitals Group     BP 11/17/20 1450 137/68     Pulse Rate 11/17/20 1450 76     Resp 11/17/20 1450 16     Temp 11/17/20 1450 98.9 F (37.2 C)     Temp Source 11/17/20 1450 Oral     SpO2 11/17/20 1450 100 %     Weight 11/17/20 1445 206 lb (93.4 kg)     Height 11/17/20 1445 5\' 7"  (1.702 m)     Head Circumference --      Peak Flow --      Pain Score 11/17/20 1444 3     Pain Loc --      Pain Edu? --      Excl. in [place]? --    No data  found.  Updated Vital Signs BP 137/68 (BP Location: Left Arm)   Pulse 76   Temp 98.9 F (37.2 C) (Oral)   Resp 16   Ht 5\' 7"  (1.702 m)   Wt 206 lb (93.4 kg)   LMP 11/08/2020   SpO2 100%   BMI 32.26 kg/m   Visual Acuity Right Eye Distance:   Left Eye Distance:   Bilateral Distance:    Right Eye Near:   Left Eye Near:    Bilateral Near:     Physical Exam Constitutional:      Appearance: Normal appearance. She is normal weight.  HENT:     Head: Normocephalic.  Eyes:     Extraocular Movements: Extraocular movements intact.  Cardiovascular:     Rate and Rhythm: Normal rate and regular rhythm.     Pulses: Normal pulses.     Heart sounds: Normal heart sounds.  Pulmonary:     Effort: Pulmonary effort is normal.     Breath sounds: Normal breath sounds.  Abdominal:     General: Abdomen is flat. Bowel sounds are normal.     Palpations: Abdomen is soft.     Tenderness: There is abdominal tenderness in the suprapubic area. There is no right CVA tenderness, left CVA tenderness, guarding or rebound.  Skin:    General: Skin is warm and dry.  Neurological:     Mental Status: She is alert and oriented to person, place, and time. Mental status is at baseline.  Psychiatric:        Mood and Affect: Mood normal.        Behavior: Behavior normal.     UC Treatments / Results  Labs (all labs ordered are listed, but only abnormal results are displayed) Labs Reviewed  URINALYSIS, COMPLETE (UACMP) WITH MICROSCOPIC - Abnormal; Notable for the following components:      Result Value   APPearance HAZY (*)    Hgb urine dipstick TRACE (*)    Leukocytes,Ua TRACE (*)    Bacteria, UA MANY (*)    All other components within normal limits  URINE CULTURE    EKG   Radiology No results found.  Procedures Procedures (including critical care time)  Medications Ordered in UC Medications - No data to display  Initial Impression / Assessment and Plan / UC Course  I have  reviewed the  triage vital signs and the nursing notes.  Pertinent labs & imaging results that were available during my care of the patient were reviewed by me and considered in my medical decision making (see chart for details).  Acute cystitis without hematuria  1.  Urinalysis showing bacteria and Gershom Brobeck blood cells, sent for culture 2.  Macrobid 800-160 mg twice daily for 3 days, poor reaction to nitrofurantoin 3.  Urgent care as needed Final Clinical Impressions(s) / UC Diagnoses   Final diagnoses:  Acute cystitis without hematuria     Discharge Instructions      Your urinalysis today showed bacteria and Amira Podolak blood cells therefore you will be treated for urinary infection  We will send urine to the lab to see exactly which bacteria is present to make sure you are on the correct antibiotic, if your medication needs to be changed you will be notified  Take Bactrim twice a day for the next 3 days  You may follow-up at urgent care as needed if symptoms do not improve after medication use   ED Prescriptions     Medication Sig Dispense Auth. Provider   sulfamethoxazole-trimethoprim (BACTRIM DS) 800-160 MG tablet Take 1 tablet by mouth 2 (two) times daily for 3 days. 6 tablet Valinda Hoar, NP      PDMP not reviewed this encounter.   Valinda Hoar, NP 11/17/20 1526

## 2020-11-17 NOTE — ED Triage Notes (Signed)
Pt c/o painful urination, x 1 week, frequent urination starting 1 day. Pressure before urination.

## 2020-11-18 LAB — URINE CULTURE: Culture: <10000 — AB

## 2020-11-24 ENCOUNTER — Other Ambulatory Visit: Payer: Self-pay

## 2020-11-24 ENCOUNTER — Ambulatory Visit
Admission: RE | Admit: 2020-11-24 | Discharge: 2020-11-24 | Disposition: A | Discharge status: Home / Self Care | Payer: Self-pay | Source: Ambulatory Visit | Attending: Emergency Medicine | Admitting: Emergency Medicine

## 2020-11-24 VITALS — BP 121/68 | HR 75 | Temp 98.4°F | Resp 18 | Ht 67.0 in | Wt 206.0 lb

## 2020-11-24 DIAGNOSIS — N760 Acute vaginitis: Secondary | ICD-10-CM

## 2020-11-24 DIAGNOSIS — B9689 Other specified bacterial agents as the cause of diseases classified elsewhere: Secondary | ICD-10-CM

## 2020-11-24 LAB — WET PREP, GENITAL
Sperm: NONE SEEN
Trich, Wet Prep: NONE SEEN
Yeast Wet Prep HPF POC: NONE SEEN

## 2020-11-24 MED ORDER — METRONIDAZOLE 500 MG PO TABS: 500.0000 mg | ORAL_TABLET | Freq: Two times a day (BID) | ORAL | 0 refills | Status: DC

## 2020-11-24 NOTE — ED Triage Notes (Signed)
Pt here with C/O itching only when using bathroom or wiping.

## 2020-11-24 NOTE — Discharge Instructions (Addendum)
Take metronidazole twice a day for 7 days  Wet prep was positive for bacterial vaginosis but not yeast or trichomoniasis  May follow up as needed

## 2020-11-24 NOTE — ED Provider Notes (Signed)
[facility]    CSN: [id number] Arrival date & time: 11/24/20  0956      History   Chief Complaint Chief Complaint  Patient presents with   Vaginal Itching    HPI Cindy Matthews is a 25 y.o. female.   Patient endorses vaginal itching for 1 week occurring only after urination.  Does endorse Elyshia Kumagai or clear vaginal discharge but attest that this is her baseline .denies urinary frequency, urgency, hematuria, abdominal pain, flank pain.  Recently treated for urinary infection, completed antibiotics as prescribed, endorses symptoms improved after.  History of GERD, asthma, eczema, anxiety, herpes.  Sexually active, 1 partner, no condom use  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    Past Surgical History:  Procedure Laterality Date   NO PAST SURGERIES     NO PAST SURGERIES     TONSILLECTOMY     TONSILLECTOMY AND ADENOIDECTOMY N/A 03/21/2016   Procedure: TONSILLECTOMY AND ADENOIDECTOMY;  Surgeon: Bud Face, MD;  Location: [facility];  Service: ENT;  Laterality: N/A;  Latex sensitivity  adenoids cauterized no tissue sent    OB History     Gravida  1   Para      Term      Preterm      AB  1   Living         SAB  1   IAB      Ectopic      Multiple      Live Births               Home Medications    Prior to Admission medications   Medication Sig Start Date End Date Taking? Authorizing Provider  Prenatal Vit-Fe Fumarate-FA (PNV PRENATAL PLUS MULTIVITAMIN) 27-1 MG TABS Take by  mouth.   Yes [provider]  PROAIR HFA [phone] Base) MCG/ACT inhaler inhale 2 puffs by mouth every 6 hours if needed for wheezing 04/12/15  Yes Fisher, Demetrios Isaacs, MD    Family History Family History  Problem Relation Age of Onset   Hypertension Maternal Grandmother    Diabetes Maternal Grandfather    Thyroid disease Paternal Grandmother     Social History Social History   Tobacco Use   Smoking status: Former    Packs/day: 0.50    Types: E-cigarettes, Cigarettes   Smokeless tobacco: Former   Tobacco comments:    Quit 4-5 months ago   Building services engineer Use: Every day  Substance Use Topics   Alcohol use: No    Alcohol/week: 0.0 standard drinks   Drug use: Not Currently    Types: Marijuana     Allergies   Cat hair extract, Dog fennel allergy skin test, Dust mite extract, Nitrofurantoin, and Latex   Review of Systems Review of Systems  Constitutional: Negative.   Respiratory: Negative.    Cardiovascular: Negative.   Genitourinary:  Positive for vaginal pain. Negative for decreased urine volume,  difficulty urinating, dyspareunia, dysuria, enuresis, flank pain, frequency, genital sores, hematuria, menstrual problem, pelvic pain, urgency, vaginal bleeding and vaginal discharge.  Skin: Negative.   Neurological: Negative.     Physical Exam Triage Vital Signs ED Triage Vitals [11/24/20 1022]  Enc Vitals Group     BP 121/68     Pulse Rate 75     Resp 18     Temp 98.4 F (36.9 C)     Temp Source Oral     SpO2 100 %     Weight 206 lb (93.4 kg)     Height 5\' 7"  (1.702 m)     Head Circumference      Peak Flow      Pain Score 0     Pain Loc      Pain Edu?      Excl. in [place]?    No data found.  Updated Vital Signs BP 121/68 (BP Location: Left Arm)   Pulse 75   Temp 98.4 F (36.9 C) (Oral)   Resp 18   Ht 5\' 7"  (1.702 m)   Wt 206 lb (93.4 kg)   LMP 11/08/2020   SpO2 100%   BMI 32.26 kg/m   Visual Acuity Right Eye Distance:   Left Eye Distance:    Bilateral Distance:    Right Eye Near:   Left Eye Near:    Bilateral Near:     Physical Exam Constitutional:      Appearance: Normal appearance. She is normal weight.  Eyes:     Extraocular Movements: Extraocular movements intact.  Pulmonary:     Effort: Pulmonary effort is normal.  Genitourinary:    Comments: Deferred, self collect vaginal swab  Skin:    General: Skin is warm and dry.  Neurological:     Mental Status: She is alert and oriented to person, place, and time. Mental status is at baseline.  Psychiatric:        Mood and Affect: Mood normal.        Behavior: Behavior normal.     UC Treatments / Results  Labs (all labs ordered are listed, but only abnormal results are displayed) Labs Reviewed  WET PREP, GENITAL - Abnormal; Notable for the following components:      Result Value   Clue Cells Wet Prep HPF POC PRESENT (*)    WBC, Wet Prep HPF POC FEW (*)    All other components within normal limits    EKG   Radiology No results found.  Procedures Procedures (including critical care time)  Medications Ordered in UC Medications - No data to display  Initial Impression / Assessment and Plan / UC Course  I have reviewed the triage vital signs and the nursing notes.  Pertinent labs & imaging results that were available during my care of the patient were reviewed by me and considered in my medical decision making (see chart for details).  Bacterial vaginosis  Metronidazole 500 mg bid for 7 days Wet prep positive for clue cells  UC follow up as needed Final Clinical Impressions(s) / UC Diagnoses   Final diagnoses:  None   Discharge Instructions   None    ED Prescriptions   None    PDMP not reviewed this encounter.   Hans Eden, NP 11/24/20 1108

## 2021-03-01 ENCOUNTER — Emergency Department
Admission: EM | Admit: 2021-03-01 | Discharge: 2021-03-01 | Disposition: A | Discharge status: Home / Self Care | Payer: Self-pay | Attending: Emergency Medicine | Admitting: Emergency Medicine

## 2021-03-01 DIAGNOSIS — E860 Dehydration: Secondary | ICD-10-CM

## 2021-03-01 DIAGNOSIS — R55 Syncope and collapse: Secondary | ICD-10-CM

## 2021-03-01 LAB — COMPREHENSIVE METABOLIC PANEL
ALT: 17 U/L (ref 0–44)
AST: 26 U/L (ref 15–41)
Albumin: 3.5 g/dL (ref 3.5–5.0)
Alkaline Phosphatase: 63 U/L (ref 38–126)
Anion gap: 8 (ref 5–15)
BUN: 13 mg/dL (ref 6–20)
CO2: 19 mmol/L — ABNORMAL LOW (ref 22–32)
Calcium: 8.1 mg/dL — ABNORMAL LOW (ref 8.9–10.3)
Chloride: 109 mmol/L (ref 98–111)
Creatinine, Ser: 0.66 mg/dL (ref 0.44–1.00)
GFR, Estimated: >60 mL/min (ref >60)
Glucose, Bld: 96 mg/dL (ref 70–99)
Potassium: 4.3 mmol/L (ref 3.5–5.1)
Sodium: 136 mmol/L (ref 135–145)
Total Bilirubin: 0.8 mg/dL (ref 0.3–1.2)
Total Protein: 6.2 g/dL — ABNORMAL LOW (ref 6.5–8.1)

## 2021-03-01 LAB — CBC WITH DIFFERENTIAL/PLATELET
Abs Immature Granulocytes: 0.09 10*3/uL — ABNORMAL HIGH (ref 0.00–0.07)
Basophils Absolute: 0.1 10*3/uL (ref 0.0–0.1)
Basophils Relative: 1 %
Eosinophils Absolute: 0.2 10*3/uL (ref 0.0–0.5)
Eosinophils Relative: 2 %
HCT: 42.5 % (ref 36.0–46.0)
Hemoglobin: 13.3 g/dL (ref 12.0–15.0)
Immature Granulocytes: 1 %
Lymphocytes Relative: 21 %
Lymphs Abs: 2.8 10*3/uL (ref 0.7–4.0)
MCH: 25 pg — ABNORMAL LOW (ref 26.0–34.0)
MCHC: 31.3 g/dL (ref 30.0–36.0)
MCV: 80 fL (ref 80.0–100.0)
Monocytes Absolute: 0.7 10*3/uL (ref 0.1–1.0)
Monocytes Relative: 6 %
Neutro Abs: 9 10*3/uL — ABNORMAL HIGH (ref 1.7–7.7)
Neutrophils Relative %: 69 %
Platelets: 238 10*3/uL (ref 150–400)
RBC: 5.31 MIL/uL — ABNORMAL HIGH (ref 3.87–5.11)
RDW: 15.2 % (ref 11.5–15.5)
WBC: 12.8 10*3/uL — ABNORMAL HIGH (ref 4.0–10.5)
nRBC: 0 % (ref 0.0–0.2)

## 2021-03-01 MED ORDER — SODIUM CHLORIDE 0.9 % IV BOLUS
1000.0000 mL | Freq: Once | INTRAVENOUS | Status: AC
  Administered 2021-03-01: 1000 mL via INTRAVENOUS

## 2021-03-01 NOTE — ED Provider Notes (Signed)
[facility] Provider Note    Event Date/Time   First MD Initiated Contact with Patient 03/01/21 1358     (approximate)   History   Near Syncope (Near syncopal, x 3 episodes , just donated plasma today, menstrual cycle now )   HPI  Alizia KAHLANI GRABER is a 26 y.o. female with LMP now, presents emergency department after a near syncopal episode.  Patient donated plasma today.  States she also is having a heavy period.  She denies any chest pain or shortness of breath.  Did feel dizzy at first but not now.  States she is feeling better since she is got fluids.  No fever or chills.      Physical Exam   Triage Vital Signs: ED Triage Vitals  Enc Vitals Group     BP 03/01/21 1354 96/67     Pulse Rate 03/01/21 1354 97     Resp 03/01/21 1354 17     Temp 03/01/21 1354 98.3 F (36.8 C)     Temp Source 03/01/21 1354 Oral     SpO2 03/01/21 1354 96 %     Weight --      Height --      Head Circumference --      Peak Flow --      Pain Score 03/01/21 1402 0     Pain Loc --      Pain Edu? --      Excl. in [place]? --     Most recent vital signs: Vitals:   03/01/21 1354  BP: 96/67  Pulse: 97  Resp: 17  Temp: 98.3 F (36.8 C)  SpO2: 96%     General: Awake, no distress.   CV:  Good peripheral perfusion. regular rate and  rhythm Resp:  Normal effort. Lungs CTA Abd:  No distention.   Other:  Cranial nerves II through XII grossly intact   ED Results / Procedures / Treatments   Labs (all labs ordered are listed, but only abnormal results are displayed) Labs Reviewed  COMPREHENSIVE METABOLIC PANEL - Abnormal; Notable for the following components:      Result Value   CO2 19 (*)    Calcium 8.1 (*)    Total Protein 6.2 (*)    All other components within normal limits  CBC WITH DIFFERENTIAL/PLATELET - Abnormal; Notable for the following components:   WBC 12.8 (*)    RBC 5.31 (*)    MCH 25.0 (*)    Neutro Abs 9.0 (*)    Abs Immature Granulocytes 0.09 (*)     All other components within normal limits     EKG  EKG   RADIOLOGY     PROCEDURES:   Procedures   MEDICATIONS ORDERED IN ED: Medications  sodium chloride 0.9 % bolus 1,000 mL (1,000 mLs Intravenous New Bag/Given 03/01/21 1417)     IMPRESSION / MDM / ASSESSMENT AND PLAN / ED COURSE  I reviewed the triage vital signs and the nursing notes.                              Differential diagnosis includes, but is not limited to, dehydration, blood loss secondary recent clinical dilation, CVA, MI, pregnancy  EKG shows normal sinus rhythm, see physician read  Labs are reassuring, CBC and metabolic panel are normal  On recheck of the patient she is feeling much better after obtaining fluids.  No dizziness.  States  she does not feel weak.  Is requesting to go home.  I do feel that due to her recent plasma donation along with her being on her menstrual cycle contributed to the near syncope.  Feel that she is stable to go home I do not feel that she is at high risk for being discharged.  She is young, healthy and does not have any concerning risk factors.      FINAL CLINICAL IMPRESSION(S) / ED DIAGNOSES   Final diagnoses:  Near syncope  Dehydration     Rx / DC Orders   ED Discharge Orders     None        Note:  This document was prepared using Dragon voice recognition software and may include unintentional dictation errors.    Faythe Ghee, PA-C 03/01/21 1600    Shaune Pollack, MD 03/02/21 [phone]

## 2021-03-01 NOTE — Discharge Instructions (Signed)
Follow-up with your regular doctor if not improving to 3 days.  Return emergency department worsening. Continue to drink plenty of fluids.

## 2021-03-01 NOTE — ED Triage Notes (Signed)
Near syncopal, x 3 episodes , just donated plasma today, menstrual cycle now

## 2021-05-02 DIAGNOSIS — R110 Nausea: Secondary | ICD-10-CM | POA: Insufficient documentation

## 2021-05-02 DIAGNOSIS — R102 Pelvic and perineal pain: Secondary | ICD-10-CM | POA: Insufficient documentation

## 2021-05-02 DIAGNOSIS — J45909 Unspecified asthma, uncomplicated: Secondary | ICD-10-CM | POA: Insufficient documentation

## 2021-05-02 DIAGNOSIS — D649 Anemia, unspecified: Secondary | ICD-10-CM | POA: Insufficient documentation

## 2021-05-03 ENCOUNTER — Encounter: Payer: Self-pay | Admitting: *Deleted

## 2021-05-03 ENCOUNTER — Emergency Department: Payer: Medicaid Other

## 2021-05-03 ENCOUNTER — Other Ambulatory Visit: Payer: Self-pay

## 2021-05-03 ENCOUNTER — Emergency Department
Admission: EM | Admit: 2021-05-03 | Discharge: 2021-05-03 | Disposition: A | Discharge status: Home / Self Care | Payer: Medicaid Other | Attending: Emergency Medicine | Admitting: Emergency Medicine

## 2021-05-03 DIAGNOSIS — R102 Pelvic and perineal pain: Secondary | ICD-10-CM

## 2021-05-03 LAB — CBC
HCT: 36.5 % (ref 36.0–46.0)
Hemoglobin: 11.4 g/dL — ABNORMAL LOW (ref 12.0–15.0)
MCH: 25.5 pg — ABNORMAL LOW (ref 26.0–34.0)
MCHC: 31.2 g/dL (ref 30.0–36.0)
MCV: 81.7 fL (ref 80.0–100.0)
Platelets: 325 10*3/uL (ref 150–400)
RBC: 4.47 MIL/uL (ref 3.87–5.11)
RDW: 16.2 % — ABNORMAL HIGH (ref 11.5–15.5)
WBC: 7.7 10*3/uL (ref 4.0–10.5)
nRBC: 0 % (ref 0.0–0.2)

## 2021-05-03 LAB — URINALYSIS, ROUTINE W REFLEX MICROSCOPIC
Bilirubin Urine: NEGATIVE
Glucose, UA: NEGATIVE mg/dL
Hgb urine dipstick: NEGATIVE
Ketones, ur: NEGATIVE mg/dL
Leukocytes,Ua: NEGATIVE
Nitrite: NEGATIVE
Protein, ur: NEGATIVE mg/dL
Specific Gravity, Urine: 1.023 (ref 1.005–1.030)
pH: 7 (ref 5.0–8.0)

## 2021-05-03 LAB — COMPREHENSIVE METABOLIC PANEL
ALT: 17 U/L (ref 0–44)
AST: 19 U/L (ref 15–41)
Albumin: 4.1 g/dL (ref 3.5–5.0)
Alkaline Phosphatase: 69 U/L (ref 38–126)
Anion gap: 7 (ref 5–15)
BUN: 13 mg/dL (ref 6–20)
CO2: 25 mmol/L (ref 22–32)
Calcium: 9.3 mg/dL (ref 8.9–10.3)
Chloride: 107 mmol/L (ref 98–111)
Creatinine, Ser: 0.72 mg/dL (ref 0.44–1.00)
GFR, Estimated: >60 mL/min (ref >60)
Glucose, Bld: 103 mg/dL — ABNORMAL HIGH (ref 70–99)
Potassium: 4.3 mmol/L (ref 3.5–5.1)
Sodium: 139 mmol/L (ref 135–145)
Total Bilirubin: 0.5 mg/dL (ref 0.3–1.2)
Total Protein: 7.5 g/dL (ref 6.5–8.1)

## 2021-05-03 LAB — LIPASE, BLOOD: Lipase: 29 U/L (ref 11–51)

## 2021-05-03 LAB — POC URINE PREG, ED: Preg Test, Ur: NEGATIVE

## 2021-05-03 LAB — HCG, QUANTITATIVE, PREGNANCY: hCG, Beta Chain, Quant, S: <1 m[IU]/mL (ref <5)

## 2021-05-03 MED ORDER — IBUPROFEN 800 MG PO TABS
800.0000 mg | ORAL_TABLET | Freq: Once | ORAL | Status: AC
  Administered 2021-05-03: 800 mg via ORAL
  Filled 2021-05-03: qty 1

## 2021-05-03 NOTE — ED Provider Notes (Signed)
? ?[facility] ?Provider Note ? ? ? Event Date/Time  ? First MD Initiated Contact with Patient 05/03/21 [phone]   ?  (approximate) ? ? ?History  ? ?Abdominal Pain ? ? ?HPI ? ?Cindy Matthews is a 26 y.o. female who presents for evaluation of abdominal pain.  Patient reports that several years ago she saw her OB/GYN and had a pelvic exam.  She was told that she may have a fibroid.  She does have a history of heavy menses.  Her last menstrual period ended a week ago.  She reports that it was heavier than normal.  The last 3 days she has been having intermittent sharp lower abdominal pain.  She reports that when she urinates she feels a burning sensation in her lower abdomen.  She is concerned that this could be her fibroid "acting up."  She reports that she does not have insurance or a doctor therefore she came here for [place] to evaluate her fibroid.  She is sexually active with 1 partner and denies any concerns for STD or vaginal discharge.  She has not really taken anything at home for the pain.  She has had some nausea with it but no vomiting, no diarrhea, no dysuria, no constipation, no fever or chills. ?  ? ? ?Past Medical History:  ?Diagnosis Date  ? ADD (attention deficit disorder)   ? No meds since 2016  ? Anxiety   ? Asthma   ? BMI 28.0-28.9,adult   ? Eczema   ? GERD (gastroesophageal reflux disease)   ? Motion sickness   ? curvy [address]  ? ? ?Past Surgical History:  ?Procedure Laterality Date  ? NO PAST SURGERIES    ? NO PAST SURGERIES    ? TONSILLECTOMY    ? TONSILLECTOMY AND ADENOIDECTOMY N/A 03/21/2016  ? Procedure: TONSILLECTOMY AND ADENOIDECTOMY;  Surgeon: Bud Face, MD;  Location: [facility];  Service: ENT;  Laterality: N/A;  Latex sensitivity ? ?adenoids cauterized no tissue sent  ? ? ? ?Physical Exam  ? ?Triage Vital Signs: ?ED Triage Vitals  ?Enc Vitals Group  ?   BP 05/03/21 0021 98/65  ?   Pulse Rate 05/03/21 0021 84  ?   Resp 05/03/21 0021 18  ?   Temp 05/03/21 0021  98.8 ?F (37.1 ?C)  ?   Temp Source 05/03/21 0021 Oral  ?   SpO2 05/03/21 0021 100 %  ?   Weight 05/03/21 0016 199 lb (90.3 kg)  ?   Height 05/03/21 0016 5\' 7"  (1.702 m)  ?   Head Circumference --   ?   Peak Flow --   ?   Pain Score 05/03/21 0015 6  ?   Pain Loc --   ?   Pain Edu? --   ?   Excl. in GC? --   ? ? ?Most recent vital signs: ?Vitals:  ? 05/03/21 0521 05/03/21 0634  ?BP: (!) 101/52 107/72  ?Pulse: 76 69  ?Resp: 16 16  ?Temp:    ?SpO2: 100% 94%  ? ? ? ?Constitutional: Alert and oriented. Well appearing and in no apparent distress. ?HEENT: ?     Head: Normocephalic and atraumatic.    ?     Eyes: Conjunctivae are normal. Sclera is non-icteric.  ?     Mouth/Throat: Mucous membranes are moist.  ?     Neck: Supple with no signs of meningismus. ?Cardiovascular: Regular rate and rhythm. No murmurs, gallops, or rubs. 2+ symmetrical  distal pulses are present in all extremities.  ?Respiratory: Normal respiratory effort. Lungs are clear to auscultation bilaterally.  ?Gastrointestinal: Soft, non tender, and non distended with positive bowel sounds. No rebound or guarding. ?Genitourinary: No CVA tenderness. ?Musculoskeletal:  No edema, cyanosis, or erythema of extremities. ?Neurologic: Normal speech and language. Face is symmetric. Moving all extremities. No gross focal neurologic deficits are appreciated. ?Skin: Skin is warm, dry and intact. No rash noted. ?Psychiatric: Mood and affect are normal. Speech and behavior are normal. ? ?ED Results / Procedures / Treatments  ? ?Labs ?(all labs ordered are listed, but only abnormal results are displayed) ?Labs Reviewed  ?COMPREHENSIVE METABOLIC PANEL - Abnormal; Notable for the following components:  ?    Result Value  ? Glucose, Bld 103 (*)   ? All other components within normal limits  ?CBC - Abnormal; Notable for the following components:  ? Hemoglobin 11.4 (*)   ? MCH 25.5 (*)   ? RDW 16.2 (*)   ? All other components within normal limits  ?URINALYSIS, ROUTINE W REFLEX  MICROSCOPIC - Abnormal; Notable for the following components:  ? Color, Urine YELLOW (*)   ? APPearance HAZY (*)   ? All other components within normal limits  ?LIPASE, BLOOD  ?HCG, QUANTITATIVE, PREGNANCY  ?POC URINE PREG, ED  ? ? ? ?EKG ? ?none ? ? ?RADIOLOGY ?I, Nita Sickle, attending MD, have personally viewed and interpreted the images obtained during this visit as below: ? ?Ultrasound with no acute abnormalities ? ? ?___________________________________________________ ?Interpretation by Radiologist:  ?US PELVIC COMPLETE W TRANSVAGINAL AND TORSION R/O ? ?Result Date: 05/03/2021 ?CLINICAL DATA:  Lower abdominal pain for 3 days. Negative pregnancy test. EXAM: TRANSABDOMINAL AND TRANSVAGINAL ULTRASOUND OF PELVIS DOPPLER ULTRASOUND OF OVARIES TECHNIQUE: Both transabdominal and transvaginal ultrasound examinations of the pelvis were performed. Transabdominal technique was performed for global imaging of the pelvis including uterus, ovaries, adnexal regions, and pelvic cul-de-sac. It was necessary to proceed with endovaginal exam following the transabdominal exam to visualize the endometrium and ovaries. Color and duplex Doppler ultrasound was utilized to evaluate blood flow to the ovaries. COMPARISON:  None. FINDINGS: Uterus Measurements: 6.3 x 3.3 x 4.6 cm = volume: 49.3 mL. No fibroids or other mass visualized. Endometrium Thickness: 7.7 mm.  No focal abnormality visualized. Right ovary Measurements: 3.8 x 2.1 x 4.0 cm = volume: 16.7 mL. Normal appearance/no adnexal mass. Multiple small simple follicles. Left ovary Measurements: 4.1 x 2.3 x 3.0 cm = volume: 14.6 mL. Normal appearance/no adnexal mass. Multiple small simple follicles. Pulsed Doppler evaluation of both ovaries demonstrates normal low-resistance arterial and venous waveforms. Other findings No abnormal free fluid. IMPRESSION: 1. No evidence of endometrial thickening, uterine mass, ovarian mass, or ovarian torsion. 2. Generous sized ovaries with  multiple follicles. The patient may have polycystic ovaries but this was an emergent scan therefore cine loops of the ovaries to count the total number of follicles in each ovary were not performed. Clinical correlation suggested. Repeat study to count the number follicles in the ovaries can be performed on a nonemergent basis if needed. Electronically Signed   By: Almira Bar M.D.   On: 05/03/2021 06:38   ? ? ? ?PROCEDURES: ? ?Critical Care performed: No ? ?Procedures ? ? ? ?IMPRESSION / MDM / ASSESSMENT AND PLAN / ED COURSE  ?I reviewed the triage vital signs and the nursing notes. ? ?26 y.o. female who presents for evaluation of abdominal pain.  3 days of intermittent suprapubic sharp abdominal pain  with nausea.  On exam she is well-appearing in no distress with normal vital signs.  Abdomen is soft with no tenderness throughout. ? ?Ddx: Constipation versus UTI versus pregnancy versus STD versus PID versus ovarian pathology.  Doubt appendicitis with intermittent pain for 3 days and no tenderness on examination. ? ? ?Plan: CBC, CMP, lipase, pregnancy test, transvaginal ultrasound.  Did recommend a pelvic exam to rule out STD and PID.  Patient declined and says that she is not concerned about that.  I did explain to her that if we are unable to rule that out as a cause of her symptoms unless we do testing.  She understands and continues to decline testing.  We will give her ibuprofen for pain. ? ? ?MEDICATIONS GIVEN IN ED: ?Medications  ?ibuprofen (ADVIL) tablet 800 mg (800 mg Oral Given 05/03/21 0352)  ? ? ? ?ED COURSE: Labs showing no leukocytosis, mild stable anemia, normal LFTs and lipase, no electrolyte derangements, negative pregnancy test.  Urinalysis with no signs of infection.  Transvaginal ultrasound showing no signs of uterine fibroids or any other acute abnormalities.  I talked again with the patient about getting STD testing for completion of her work-up but again patient declined.  She remains with  no tenderness to palpation on her exam.  With no tenderness, no fever, no leukocytosis, and 3 days of fever low suspicion for appendicitis therefore we will hold off any CT scanning at this time.  Recomm

## 2021-05-03 NOTE — Discharge Instructions (Addendum)

## 2021-05-03 NOTE — ED Triage Notes (Signed)
Pt reports low abd pain.  Pt states she has fibroids and menses was heavier than normal.  No bleeding now.  No vomiting.  Pt reports soft stools.  Pain for 3 days.  Pt alert  speech clear.  ?

## 2021-05-12 ENCOUNTER — Ambulatory Visit: Payer: Medicaid Other

## 2021-05-18 ENCOUNTER — Emergency Department
Admission: EM | Admit: 2021-05-18 | Discharge: 2021-05-18 | Discharge status: Against Medical Advice | Payer: Medicaid Other | Attending: Emergency Medicine | Admitting: Emergency Medicine

## 2021-05-18 ENCOUNTER — Other Ambulatory Visit: Payer: Self-pay

## 2021-05-18 ENCOUNTER — Encounter: Payer: Self-pay | Admitting: Emergency Medicine

## 2021-05-18 DIAGNOSIS — R300 Dysuria: Secondary | ICD-10-CM | POA: Insufficient documentation

## 2021-05-18 DIAGNOSIS — Z5321 Procedure and treatment not carried out due to patient leaving prior to being seen by health care provider: Secondary | ICD-10-CM | POA: Insufficient documentation

## 2021-05-18 LAB — URINALYSIS, ROUTINE W REFLEX MICROSCOPIC
Bilirubin Urine: NEGATIVE
Glucose, UA: NEGATIVE mg/dL
Hgb urine dipstick: NEGATIVE
Ketones, ur: NEGATIVE mg/dL
Leukocytes,Ua: NEGATIVE
Nitrite: NEGATIVE
Protein, ur: NEGATIVE mg/dL
Specific Gravity, Urine: 1.03 (ref 1.005–1.030)
pH: 5 (ref 5.0–8.0)

## 2021-05-18 LAB — POC URINE PREG, ED: Preg Test, Ur: NEGATIVE

## 2021-05-18 NOTE — ED Triage Notes (Addendum)
Patient ambulatory to triage with steady gait, without difficulty or distress noted; pt reports awoke with dysuria; denies any abd/back pain ?

## 2021-05-27 ENCOUNTER — Ambulatory Visit
Admission: EM | Admit: 2021-05-27 | Discharge: 2021-05-27 | Disposition: A | Discharge status: Home / Self Care | Payer: Medicaid Other | Attending: Physician Assistant | Admitting: Physician Assistant

## 2021-05-27 DIAGNOSIS — B001 Herpesviral vesicular dermatitis: Secondary | ICD-10-CM

## 2021-05-27 DIAGNOSIS — K130 Diseases of lips: Secondary | ICD-10-CM

## 2021-05-27 MED ORDER — MUPIROCIN 2 % EX OINT: 1.0000 "application " | TOPICAL_OINTMENT | Freq: Two times a day (BID) | CUTANEOUS | 0 refills | Status: DC

## 2021-05-27 MED ORDER — VALACYCLOVIR HCL 1 G PO TABS: 2000.0000 mg | ORAL_TABLET | Freq: Two times a day (BID) | ORAL | 0 refills | Status: AC

## 2021-05-27 NOTE — ED Triage Notes (Signed)
Patient c.o 2 days ago a blister popped up on her mouth. Spreading around her bottom lip.  ?

## 2021-05-27 NOTE — ED Provider Notes (Signed)
?[facility] ? ? ? ?CSN: [id number] ?Arrival date & time: 05/27/21  1315 ? ? ?  ? ?History   ?Chief Complaint ?Chief Complaint  ?Patient presents with  ? Mouth Lesions  ? ? ?HPI ?Cindy Matthews is a 26 y.o. female presenting for 2-day history of a blister of the left lower lip that is painful.  She states that it started out like a pustule and then it scabbed over.  She has another small pustule near it.  Has history of cold sores but says this is a little different.  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  Patient concerned because she went to the beach and does not know she got it infected somehow.  She has not been treating it in any way. ? ?HPI ? ?Past Medical History:  ?Diagnosis Date  ? ADD (attention deficit disorder)   ? No meds since 2016  ? Anxiety   ? Asthma   ? BMI 28.0-28.9,adult   ? Eczema   ? GERD (gastroesophageal reflux disease)   ? Motion sickness   ? curvy [address]  ? ? ?Patient Active Problem List  ? Diagnosis Date Noted  ? Anxiety 01/30/2016  ? History of recurrent UTIs 11/13/2014  ? ADD (attention deficit disorder) 05/25/2014  ? Dermatitis, eczematoid 05/25/2014  ? Acid reflux 05/25/2014  ? H/O herpes labialis 05/25/2014  ? ? ?Past Surgical History:  ?Procedure Laterality Date  ? NO PAST SURGERIES    ? NO PAST SURGERIES    ? TONSILLECTOMY    ? TONSILLECTOMY AND ADENOIDECTOMY N/A 03/21/2016  ? Procedure: TONSILLECTOMY AND ADENOIDECTOMY;  Surgeon: Bud Face, MD;  Location: [facility];  Service: ENT;  Laterality: N/A;  Latex sensitivity ? ?adenoids cauterized no tissue sent  ? ? ?OB History   ? ? Gravida  ?1  ? Para  ?   ? Term  ?   ? Preterm  ?   ? AB  ?1  ? Living  ?   ?  ? ? SAB  ?1  ? IAB  ?   ? Ectopic  ?   ? Multiple  ?   ? Live Births  ?   ?   ?  ?  ? ? ? ?Home Medications   ? ?Prior to Admission medications   ?Medication Sig Start Date End Date Taking? Authorizing Provider  ?metroNIDAZOLE (FLAGYL) 500 MG tablet Take 1 tablet (500 mg total) by mouth 2 (two)  times daily. 11/24/20  Yes White, Elita Boone, NP  ?mupirocin ointment (BACTROBAN) 2 % Apply 1 application. topically 2 (two) times daily. 05/27/21  Yes Shirlee Latch, PA-C  ?PROAIR HFA 108 [phone] Base) MCG/ACT inhaler inhale 2 puffs by mouth every 6 hours if needed for wheezing 04/12/15  Yes Fisher, Demetrios Isaacs, MD  ?valACYclovir (VALTREX) 1000 MG tablet Take 2 tablets (2,000 mg total) by mouth 2 (two) times daily for 1 day. 05/27/21 05/28/21 Yes Shirlee Latch, PA-C  ? ? ?Family History ?Family History  ?Problem Relation Age of Onset  ? Hypertension Maternal Grandmother   ? Diabetes Maternal Grandfather   ? Thyroid disease Paternal Grandmother   ? ? ?Social History ?Social History  ? ?Tobacco Use  ? Smoking status: Former  ?  Packs/day: 0.50  ?  Types: E-cigarettes, Cigarettes  ? Smokeless tobacco: Former  ? Tobacco comments:  ?  Quit 4-5 months ago   ?Vaping Use  ? Vaping Use: Every day  ?Substance Use  Topics  ? Alcohol use: No  ?  Alcohol/week: 0.0 standard drinks  ? Drug use: Not Currently  ?  Types: Marijuana  ? ? ? ?Allergies   ?Cat hair extract, Dog fennel allergy skin test, Dust mite extract, Nitrofurantoin, and Latex ? ? ?Review of Systems ?Review of Systems  ?Constitutional:  Negative for fatigue and fever.  ?HENT:  Positive for mouth sores. Negative for congestion, facial swelling and sore throat.   ? ? ?Physical Exam ?Triage Vital Signs ?ED Triage Vitals  ?Enc Vitals Group  ?   BP 05/27/21 1419 126/73  ?   Pulse Rate 05/27/21 1419 81  ?   Resp 05/27/21 1419 20  ?   Temp 05/27/21 1419 98.6 ?F (37 ?C)  ?   Temp Source 05/27/21 1419 Oral  ?   SpO2 05/27/21 1419 100 %  ?   Weight 05/27/21 1418 191 lb (86.6 kg)  ?   Height 05/27/21 1418 5\' 9"  (1.753 m)  ?   Head Circumference --   ?   Peak Flow --   ?   Pain Score 05/27/21 1418 0  ?   Pain Loc --   ?   Pain Edu? --   ?   Excl. in GC? --   ? ?No data found. ? ?Updated Vital Signs ?BP 126/73 (BP Location: Left Arm)   Pulse 81   Temp 98.6 ?F (37 ?C) (Oral)   Resp 20    Ht 5\' 9"  (1.753 m)   Wt 191 lb (86.6 kg)   LMP 05/01/2021   SpO2 100%   BMI 28.21 kg/m?  ? ? ?Physical Exam ?Vitals and nursing note reviewed.  ?Constitutional:   ?   General: She is not in acute distress. ?   Appearance: Normal appearance. She is not ill-appearing or toxic-appearing.  ?HENT:  ?   Head: Normocephalic and atraumatic.  ?   Nose: Nose normal.  ?   Mouth/Throat:  ?   Mouth: Mucous membranes are moist.  ?   Pharynx: Oropharynx is clear.  ?   Comments: Ulceration of lower lip with overlying yellow crusting and dry flaking skin.  Small pustule adjacent to this.  Tender to palpation. ?Eyes:  ?   General: No scleral icterus.    ?   Right eye: No discharge.     ?   Left eye: No discharge.  ?   Conjunctiva/sclera: Conjunctivae normal.  ?Cardiovascular:  ?   Rate and Rhythm: Normal rate and regular rhythm.  ?Pulmonary:  ?   Effort: Pulmonary effort is normal. No respiratory distress.  ?Musculoskeletal:  ?   Cervical back: Neck supple.  ?Skin: ?   General: Skin is dry.  ?Neurological:  ?   General: No focal deficit present.  ?   Mental Status: She is alert. Mental status is at baseline.  ?   Motor: No weakness.  ?   Gait: Gait normal.  ?Psychiatric:     ?   Mood and Affect: Mood normal.     ?   Behavior: Behavior normal.     ?   Thought Content: Thought content normal.  ? ? ? ?UC Treatments / Results  ?Labs ?(all labs ordered are listed, but only abnormal results are displayed) ?Labs Reviewed - No data to display ? ?EKG ? ? ?Radiology ?No results found. ? ?Procedures ?Procedures (including critical care time) ? ?Medications Ordered in UC ?Medications - No data to display ? ?Initial Impression / Assessment and Plan /  UC Course  ?I have reviewed the triage vital signs and the nursing notes. ? ?Pertinent labs & imaging results that were available during my care of the patient were reviewed by me and considered in my medical decision making (see chart for details). ? ?26 year old female presenting for lip  lesion that is tender to palpation.  It does have overlying yellow crusting and a small pustule adjacent to it.  History of cold sores and MRSA (as a child).  Exam consistent with suspected herpes labialis.  Question possible secondary infection especially since she has admitted to picking at it and licking her lip.  We will treat at this time with Valtrex and mupirocin ointment.  I have advised close monitoring and return or call us if no improvement in next few days as it is possible she may need an oral antibiotic such as Bactrim DS. ? ? ?Final Clinical Impressions(s) / UC Diagnoses  ? ?Final diagnoses:  ?Herpes labialis  ?Sore of lower lip  ? ? ? ?Discharge Instructions   ? ?  ?-Likely just a cold sore but could be secondarily infected with bacteria swab also sent mupirocin ointment. ?- Do not pick at the lip or liquid. ?- If not improving after about 3 days or if symptoms worsen, return or inform us as you might need an oral antibiotic, but I think you probably will not. ? ? ? ? ?ED Prescriptions   ? ? Medication Sig Dispense Auth. Provider  ? valACYclovir (VALTREX) 1000 MG tablet Take 2 tablets (2,000 mg total) by mouth 2 (two) times daily for 1 day. 4 tablet Eusebio FriendlyEaves, Lavren Lewan B, PA-C  ? mupirocin ointment (BACTROBAN) 2 % Apply 1 application. topically 2 (two) times daily. 22 g Shirlee LatchEaves, Psalms Olarte B, PA-C  ? ?  ? ?PDMP not reviewed this encounter. ?  ?Shirlee Latchaves, Azul Brumett B, PA-C ?05/27/21 1505 ? ?

## 2021-05-27 NOTE — Discharge Instructions (Signed)
-  Likely just a cold sore but could be secondarily infected with bacteria swab also sent mupirocin ointment. ?- Do not pick at the lip or liquid. ?- If not improving after about 3 days or if symptoms worsen, return or inform us as you might need an oral antibiotic, but I think you probably will not. ?

## 2021-06-13 ENCOUNTER — Ambulatory Visit
Admission: EM | Admit: 2021-06-13 | Discharge: 2021-06-13 | Disposition: A | Discharge status: Home / Self Care | Payer: Medicaid Other | Attending: Internal Medicine | Admitting: Internal Medicine

## 2021-06-13 ENCOUNTER — Encounter: Payer: Self-pay | Admitting: Emergency Medicine

## 2021-06-13 DIAGNOSIS — B3731 Acute candidiasis of vulva and vagina: Secondary | ICD-10-CM | POA: Insufficient documentation

## 2021-06-13 DIAGNOSIS — B9689 Other specified bacterial agents as the cause of diseases classified elsewhere: Secondary | ICD-10-CM | POA: Insufficient documentation

## 2021-06-13 DIAGNOSIS — N3000 Acute cystitis without hematuria: Secondary | ICD-10-CM | POA: Insufficient documentation

## 2021-06-13 DIAGNOSIS — N760 Acute vaginitis: Secondary | ICD-10-CM | POA: Insufficient documentation

## 2021-06-13 LAB — URINALYSIS, MICROSCOPIC (REFLEX): RBC / HPF: NONE SEEN RBC/hpf (ref 0–5)

## 2021-06-13 LAB — URINALYSIS, ROUTINE W REFLEX MICROSCOPIC
Bilirubin Urine: NEGATIVE
Glucose, UA: NEGATIVE mg/dL
Hgb urine dipstick: NEGATIVE
Ketones, ur: NEGATIVE mg/dL
Leukocytes,Ua: NEGATIVE
Nitrite: POSITIVE — AB
Protein, ur: NEGATIVE mg/dL
Specific Gravity, Urine: 1.015 (ref 1.005–1.030)
pH: 7 (ref 5.0–8.0)

## 2021-06-13 LAB — WET PREP, GENITAL
Sperm: NONE SEEN
Trich, Wet Prep: NONE SEEN
WBC, Wet Prep HPF POC: >10 — AB

## 2021-06-13 MED ORDER — CIPROFLOXACIN HCL 500 MG PO TABS: 500.0000 mg | ORAL_TABLET | Freq: Two times a day (BID) | ORAL | 0 refills | Status: DC

## 2021-06-13 MED ORDER — FLUCONAZOLE 150 MG PO TABS: 150.0000 mg | ORAL_TABLET | Freq: Every day | ORAL | 0 refills | Status: DC

## 2021-06-13 MED ORDER — METRONIDAZOLE 500 MG PO TABS: 500.0000 mg | ORAL_TABLET | Freq: Two times a day (BID) | ORAL | 0 refills | Status: DC

## 2021-06-13 NOTE — ED Provider Notes (Signed)
[facility]    CSN: [id number] Arrival date & time: 06/13/21  1815      History   Chief Complaint Chief Complaint  Patient presents with   Vaginal Itching    HPI Cindy Matthews is a 26 y.o. female who presents with vaginal itching and foul smelling urine x 2 days. She denies dysuria or concern of STD's. Admits she did not void after sex this weekend.     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    Past Surgical History:  Procedure Laterality Date   NO PAST SURGERIES     NO PAST SURGERIES     TONSILLECTOMY     TONSILLECTOMY AND ADENOIDECTOMY N/A 03/21/2016   Procedure: TONSILLECTOMY AND ADENOIDECTOMY;  Surgeon: Bud Facereighton Vaught, MD;  [facility];  Service: ENT;  Laterality: N/A;  Latex sensitivity  adenoids cauterized no tissue sent    OB History     Gravida  1   Para      Term      Preterm      AB  1   Living         SAB  1   IAB      Ectopic      Multiple      Live Births               Home Medications    Prior to Admission medications   Medication Sig Start Date End Date Taking? Authorizing Provider  ciprofloxacin (CIPRO) 500 MG tablet Take 1 tablet (500 mg total) by mouth 2 (two) times daily. 06/13/21  Yes Rodriguez-Southworth, Nettie ElmSylvia, PA-C  fluconazole (DIFLUCAN) 150 MG tablet Take 1 tablet (150 mg total) by mouth daily. 06/13/21  Yes Rodriguez-Southworth, Nettie ElmSylvia, PA-C  metroNIDAZOLE (FLAGYL) 500 MG tablet Take 1 tablet (500 mg total) by mouth 2 (two) times  daily. 06/13/21   Rodriguez-Southworth, Nettie ElmSylvia, PA-C  mupirocin ointment (BACTROBAN) 2 % Apply 1 application. topically 2 (two) times daily. 05/27/21   Shirlee LatchEaves, Lesley B, PA-C  PROAIR HFA 108 [phone](90 Base) MCG/ACT inhaler inhale 2 puffs by mouth every 6 hours if needed for wheezing 04/12/15   Malva LimesFisher, Donald E, MD    Family History Family History  Problem Relation Age of Onset   Hypertension Maternal Grandmother    Diabetes Maternal Grandfather    Thyroid disease Paternal Grandmother     Social History Social History   Tobacco Use   Smoking status: Former    Packs/day: 0.50    Types: E-cigarettes, Cigarettes   Smokeless tobacco: Former   Tobacco comments:    Quit 4-5 months ago   Building services engineerVaping Use   Vaping Use: Every day  Substance Use Topics   Alcohol use: No    Alcohol/week: 0.0 standard drinks   Drug use: Not Currently    Types: Marijuana     Allergies   Cat hair extract, Dog fennel allergy skin test, Dust mite extract, Nitrofurantoin, and  Latex   Review of Systems Review of Systems  Constitutional:  Negative for fever.  Genitourinary:  Negative for dysuria, flank pain, frequency, pelvic pain, urgency, vaginal discharge and vaginal pain.       + vaginal itching    Physical Exam Triage Vital Signs ED Triage Vitals [06/13/21 1833]  Enc Vitals Group     BP 119/82     Pulse Rate 88     Resp 18     Temp 98.8 F (37.1 C)     Temp Source Oral     SpO2 98 %     Weight      Height      Head Circumference      Peak Flow      Pain Score 0     Pain Loc      Pain Edu?      Excl. in GC?    No data found.  Updated Vital Signs BP 119/82 (BP Location: Left Arm)   Pulse 88   Temp 98.8 F (37.1 C) (Oral)   Resp 18   SpO2 98%   Visual Acuity Right Eye Distance:   Left Eye Distance:   Bilateral Distance:    Right Eye Near:   Left Eye Near:    Bilateral Near:      Physical Exam Vitals and nursing note reviewed.  Constitutional:      General: She is not in acute  distress.    Appearance: She is not toxic-appearing.  HENT:     Head: Normocephalic.     Right Ear: External ear normal.     Left Ear: External ear normal.  Eyes:     General: No scleral icterus.    Conjunctiva/sclera: Conjunctivae normal.  Pulmonary:     Effort: Pulmonary effort is normal.  Abdominal:     General: Bowel sounds are normal.     Palpations: Abdomen is soft. There is no mass.     Tenderness: There is no guarding or rebound.     Comments: - CVA tenderness   Musculoskeletal:        General: Normal range of motion.     Cervical back: Neck supple.       Skin:    General: Skin is warm and dry.     Findings: No rash.  Neurological:     Mental Status: She is alert and oriented to person, place, and time.     Gait: Gait normal.  Psychiatric:        Mood and Affect: Mood normal.        Behavior: Behavior normal.        Thought Content: Thought content normal.        Judgment: Judgment normal. ;  UC Treatments / Results  Labs (all labs ordered are listed, but only abnormal results are displayed) Labs Reviewed  WET PREP, GENITAL - Abnormal; Notable for the following components:      Result Value   Yeast Wet Prep HPF POC PRESENT (*)    Clue Cells Wet Prep HPF POC PRESENT (*)    WBC, Wet Prep HPF POC >10 (*)    All other components within normal limits  URINALYSIS, ROUTINE W REFLEX MICROSCOPIC - Abnormal; Notable for the following components:   Color, Urine STRAW (*)    APPearance HAZY (*)    Nitrite POSITIVE (*)    All other components within normal limits  URINALYSIS, MICROSCOPIC (REFLEX) - Abnormal; Notable for the following components:  Bacteria, UA MANY (*)    All other components within normal limits    EKG   Radiology No results found.  Procedures Procedures (including critical care time)  Medications Ordered in UC Medications - No data to display  Initial Impression / Assessment and Plan / UC Course  I have reviewed the triage vital signs and  the nursing notes.  Pertinent labs  results that were available during my care of the patient were reviewed by me and considered in my medical decision making (see chart for details).  Has UTI BV Candida vaginitis  I placed her on Diflucan, Flagel, and Cipro as noted.    Final Clinical Impressions(s) / UC Diagnoses   Final diagnoses:  Acute cystitis without hematuria  Bacterial vaginitis  Candida vaginitis   Discharge Instructions   None    ED Prescriptions     Medication Sig Dispense Auth. Provider   metroNIDAZOLE (FLAGYL) 500 MG tablet Take 1 tablet (500 mg total) by mouth 2 (two) times daily. 14 tablet Rodriguez-Southworth, Canyon Lohr, PA-C   fluconazole (DIFLUCAN) 150 MG tablet Take 1 tablet (150 mg total) by mouth daily. 2 tablet Rodriguez-Southworth, Nettie Elm, PA-C   ciprofloxacin (CIPRO) 500 MG tablet Take 1 tablet (500 mg total) by mouth 2 (two) times daily. 10 tablet Rodriguez-Southworth, Nettie Elm, PA-C      PDMP not reviewed this encounter.   Garey Ham, [place] 06/13/21 1926

## 2021-06-13 NOTE — ED Triage Notes (Signed)
Pt c/o vaginal itching and foul smelling urine x 2 days

## 2021-09-27 ENCOUNTER — Ambulatory Visit
Admission: EM | Admit: 2021-09-27 | Discharge: 2021-09-27 | Disposition: A | Discharge status: Home / Self Care | Payer: Self-pay | Attending: Physician Assistant | Admitting: Physician Assistant

## 2021-09-27 DIAGNOSIS — N898 Other specified noninflammatory disorders of vagina: Secondary | ICD-10-CM | POA: Insufficient documentation

## 2021-09-27 LAB — URINALYSIS, ROUTINE W REFLEX MICROSCOPIC
Bilirubin Urine: NEGATIVE
Glucose, UA: NEGATIVE mg/dL
Hgb urine dipstick: NEGATIVE
Ketones, ur: NEGATIVE mg/dL
Leukocytes,Ua: NEGATIVE
Nitrite: NEGATIVE
Protein, ur: NEGATIVE mg/dL
Specific Gravity, Urine: 1.02 (ref 1.005–1.030)
pH: 6.5 (ref 5.0–8.0)

## 2021-09-27 LAB — WET PREP, GENITAL
Clue Cells Wet Prep HPF POC: NONE SEEN
Sperm: NONE SEEN
Trich, Wet Prep: NONE SEEN
WBC, Wet Prep HPF POC: <10 — AB (ref <10)
Yeast Wet Prep HPF POC: NONE SEEN

## 2021-09-27 LAB — PREGNANCY, URINE: Preg Test, Ur: NEGATIVE

## 2021-09-27 NOTE — ED Triage Notes (Signed)
Pt reports vaginal itching and odor x 2 days.   Pt also ask for pregnancy test, last menstrual cycle on 09/14/21. Sx of nausea and low abdomen discomfort.

## 2021-09-27 NOTE — Discharge Instructions (Addendum)
Your vaginal swab today did not reveal the presence of the bacterial vaginosis, trichomonas, or a vaginal yeast infection.  Your urinalysis was also completely unremarkable.  It is unclear as to what is causing the vaginal odor or the itching that you experienced at the end of urination.  Your gonorrhea and Chlamydia testing will be back tomorrow.  If either of those tests are positive you will be contacted by phone and treatment options will be made available to you.  If your results are negative they will just appear in your MyChart.  I recommend you increase your oral fluid intake so that you increase your urine flexion and help flush your urinary tract.  Return for reevaluation for any new or worsening symptoms.

## 2021-09-27 NOTE — ED Provider Notes (Signed)
[facility]    CSN: [id number] Arrival date & time: 09/27/21  1509      History   Chief Complaint Chief Complaint  Patient presents with   SEXUALLY TRANSMITTED DISEASE    HPI Cindy Matthews is a 26 y.o. female.   HPI  26 year old female here for evaluation of vaginal itching.  Patient reports that she has been having vaginal itching and odor for the past 2 days.  This has been associated with a slight increase in urinary frequency, low back pain that has been intermittent, and she had an episode of nausea 2 days ago when her symptoms started.  She denies any abnormal vaginal discharge, pain with urination or urinary urgency.  She also denies any blood in her urine.  Her last menstrual cycle ended on 09/14/2021 but she states that she and her boyfriend had unprotected intercourse at the height of her ovulation cycle and she is concerned she may be pregnant.  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    Past Surgical History:  Procedure Laterality Date   NO PAST SURGERIES     NO PAST SURGERIES     TONSILLECTOMY     TONSILLECTOMY AND ADENOIDECTOMY N/A 03/21/2016   Procedure: TONSILLECTOMY AND ADENOIDECTOMY;  Surgeon: Bud Face, MD;  Location: [facility];  Service: ENT;  Laterality: N/A;  Latex sensitivity  adenoids cauterized no tissue sent    OB History     Gravida  1   Para      Term      Preterm      AB  1   Living         SAB  1   IAB      Ectopic      Multiple      Live Births                Home Medications    Prior to Admission medications   Not on File    Family History Family History  Problem Relation Age of Onset   Hypertension Maternal Grandmother    Diabetes Maternal Grandfather    Thyroid disease Paternal Grandmother     Social History Social History   Tobacco Use   Smoking status: Former    Packs/day: 0.50    Types: E-cigarettes, Cigarettes   Smokeless tobacco: Former   Tobacco comments:    Quit 4-5 months ago   Building services engineer Use: Every day  Substance Use Topics   Alcohol use: No    Alcohol/week: 0.0 standard drinks of alcohol   Drug use: Not Currently    Types: Marijuana     Allergies   Cat hair extract, Dog fennel allergy skin test, Dust mite extract, Nitrofurantoin, and Latex   Review of Systems Review of Systems  Constitutional:  Negative for fever.  Gastrointestinal:  Positive for nausea.  Genitourinary:  Positive for frequency and vaginal pain. Negative for dysuria,  hematuria, urgency and vaginal discharge.  Musculoskeletal:  Positive for back pain.  Hematological: Negative.   Psychiatric/Behavioral: Negative.       Physical Exam Triage Vital Signs ED Triage Vitals  Enc Vitals Group     BP 09/27/21 1527 117/70     Pulse Rate 09/27/21 1527 100     Resp 09/27/21 1527 20     Temp 09/27/21 1527 98.4 F (36.9 C)     Temp Source 09/27/21 1527 Oral     SpO2 09/27/21 1527 100 %     Weight 09/27/21 1529 180 lb (81.6 kg)     Height 09/27/21 1529 5\' 8"  (1.727 m)     Head Circumference --      Peak Flow --      Pain Score 09/27/21 1528 2     Pain Loc --      Pain Edu? --      Excl. in GC? --    No data found.  Updated Vital Signs BP 117/70 (BP Location: Left Arm)   Pulse 100   Temp 98.4 F (36.9 C) (Oral)   Resp 20   Ht 5\' 8"  (1.727 m)   Wt 180 lb (81.6 kg)   LMP 09/14/2021   SpO2 100%   BMI 27.37 kg/m   Visual Acuity Right Eye Distance:   Left Eye Distance:   Bilateral Distance:     Right Eye Near:   Left Eye Near:    Bilateral Near:     Physical Exam Vitals and nursing note reviewed.  Constitutional:      Appearance: Normal appearance. She is not ill-appearing.  HENT:     Head: Normocephalic and atraumatic.  Cardiovascular:     Rate and Rhythm: Normal rate and regular rhythm.     Pulses: Normal pulses.     Heart sounds: Normal heart sounds. No murmur heard.    No friction rub. No gallop.  Pulmonary:     Effort: Pulmonary effort is normal.     Breath sounds: Normal breath sounds. No wheezing, rhonchi or rales.  Abdominal:     General: Abdomen is flat.     Palpations: Abdomen is soft.     Tenderness: There is no abdominal tenderness. There is no right CVA tenderness or left CVA tenderness.  Skin:    General: Skin is warm and dry.     Capillary Refill: Capillary refill takes less than 2 seconds.     Findings: No erythema or rash.  Neurological:     General: No focal deficit present.     Mental Status: She is alert and oriented to person, place, and time.  Psychiatric:        Mood and Affect: Mood normal.        Behavior: Behavior normal.        Thought Content: Thought content normal.        Judgment: Judgment normal.      UC Treatments / Results  Labs (all labs ordered are listed, but only abnormal results are displayed) Labs Reviewed  WET PREP, GENITAL - Abnormal; Notable for the following components:      Result Value   WBC, Wet Prep HPF POC <10 (*)    All other components within normal limits  URINALYSIS, ROUTINE W REFLEX MICROSCOPIC  PREGNANCY, URINE  CERVICOVAGINAL ANCILLARY ONLY    EKG   Radiology No results found.  Procedures Procedures (including critical care time)  Medications Ordered in UC Medications - No data to display  Initial Impression / Assessment and Plan / UC Course  I have reviewed the triage vital signs and the nursing notes.  Pertinent labs & imaging results that were available during my care of the  patient were reviewed by me and considered in my medical decision making (see chart for details).   Patient is a nontoxic-appearing 26 year old female here for evaluation of vaginal itching and odor with associated urinary frequency, intermittent low back pain, and nausea that occurred 2 days ago.  Patient denies any abnormal vaginal discharge.  She is concerned she may be pregnant because of the nausea 2 days ago.  The nausea is not still present.  On exam patient is a benign cardiopulmonary exam revealing S1-S2 heart sounds with regular rate and rhythm and lung sounds are clear to auscultation all fields.  No CVA tenderness on exam.  Abdomen is soft and nontender.  I will order a vaginal wet prep, gonorrhea and chlamydia, urinalysis, and urine pregnancy test.  Urine pregnancy test is negative.  Vaginal wet prep does not show any evidence of yeast, trichomonas, or clue cells.  Urinalysis is completely unremarkable.  No evidence of nitrites or leukocyte esterase.  Specific gravity is normal at 1.020.  The source of the patient's vaginal itching and odor is unclear but there is no evidence of vaginal infection from BV, yeast infection, or trichomoniasis.  There is also no evidence of UTI.  I will discharge her home and have her increase her oral fluid intake of water to increase her urine production and help flush her urinary system.  If her gonorrhea and chlamydia test come back positive I will order treatment at that time but I would not treat her empirically.   Final Clinical Impressions(s) / UC Diagnoses   Final diagnoses:  Vaginal itching     Discharge Instructions      Your vaginal swab today did not reveal the presence of the bacterial vaginosis, trichomonas, or a vaginal yeast infection.  Your urinalysis was also completely unremarkable.  It is unclear as to what is causing the vaginal odor or the itching that you experienced at the end of urination.  Your gonorrhea and Chlamydia  testing will be back tomorrow.  If either of those tests are positive you will be contacted by phone and treatment options will be made available to you.  If your results are negative they will just appear in your MyChart.  I recommend you increase your oral fluid intake so that you increase your urine flexion and help flush your urinary tract.  Return for reevaluation for any new or worsening symptoms.     ED Prescriptions   None    PDMP not reviewed this encounter.   Becky Augusta, NP 09/27/21 1627

## 2021-09-29 ENCOUNTER — Telehealth (HOSPITAL_COMMUNITY): Payer: Self-pay | Admitting: Emergency Medicine

## 2021-09-29 LAB — CERVICOVAGINAL ANCILLARY ONLY
Chlamydia: POSITIVE — AB
Comment: NEGATIVE
Comment: NORMAL
Neisseria Gonorrhea: NEGATIVE

## 2021-09-29 MED ORDER — DOXYCYCLINE HYCLATE 100 MG PO CAPS: 100.0000 mg | ORAL_CAPSULE | Freq: Two times a day (BID) | ORAL | 0 refills | Status: AC

## 2021-10-25 ENCOUNTER — Ambulatory Visit
Admission: RE | Admit: 2021-10-25 | Discharge: 2021-10-25 | Disposition: A | Discharge status: Home / Self Care | Payer: Self-pay | Source: Ambulatory Visit | Attending: Emergency Medicine | Admitting: Emergency Medicine

## 2021-10-25 VITALS — BP 118/70 | HR 90 | Temp 98.4°F | Ht 68.0 in | Wt 179.9 lb

## 2021-10-25 DIAGNOSIS — B9689 Other specified bacterial agents as the cause of diseases classified elsewhere: Secondary | ICD-10-CM

## 2021-10-25 DIAGNOSIS — N760 Acute vaginitis: Secondary | ICD-10-CM

## 2021-10-25 LAB — URINALYSIS, ROUTINE W REFLEX MICROSCOPIC
Bilirubin Urine: NEGATIVE
Glucose, UA: NEGATIVE mg/dL
Hgb urine dipstick: NEGATIVE
Ketones, ur: NEGATIVE mg/dL
Leukocytes,Ua: NEGATIVE
Nitrite: NEGATIVE
Protein, ur: NEGATIVE mg/dL
Specific Gravity, Urine: 1.01 (ref 1.005–1.030)
pH: 6 (ref 5.0–8.0)

## 2021-10-25 LAB — PREGNANCY, URINE: Preg Test, Ur: NEGATIVE

## 2021-10-25 LAB — WET PREP, GENITAL
Sperm: NONE SEEN
Trich, Wet Prep: NONE SEEN
WBC, Wet Prep HPF POC: <10 — AB (ref <10)
Yeast Wet Prep HPF POC: NONE SEEN

## 2021-10-25 MED ORDER — METRONIDAZOLE 500 MG PO TABS: 500.0000 mg | ORAL_TABLET | Freq: Two times a day (BID) | ORAL | 0 refills | Status: DC

## 2021-10-25 NOTE — ED Triage Notes (Addendum)
Patient reports that she is having frequent urination, vagina is itchy. Patient reports some discharge.   Patient reports she and her partner had a lot of sex over the weekend and is unsure if the symptoms are from that

## 2021-10-25 NOTE — Discharge Instructions (Addendum)

## 2021-10-25 NOTE — ED Provider Notes (Signed)
[facility]    CSN: [id number] Arrival date & time: 10/25/21  1119      History   Chief Complaint Chief Complaint  Patient presents with   Urinary Frequency   Vaginal Itching    HPI Cindy Matthews is a 26 y.o. female.   HPI  26 year old female here for evaluation of GYN complaints.  Patient reports that she has been experiencing increased frequency of urination with burning of her labia with urination along with vaginal itching and a thicker clear vaginal discharge.  She does endorse a mild odor.  She denies any fever, blood in her urine, or cloudiness to her urine.  She states that she and her boyfriend were together over the weekend and had a lot of intercourse and she thinks this may have upset her pH balance in her vaginal vault.  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    Past Surgical History:  Procedure Laterality Date   NO PAST SURGERIES     NO PAST SURGERIES     TONSILLECTOMY     TONSILLECTOMY AND ADENOIDECTOMY N/A 03/21/2016   Procedure: TONSILLECTOMY AND ADENOIDECTOMY;  Surgeon: Carloyn Manner, MD;  Location: [place];  Service: ENT;  Laterality: N/A;  Latex sensitivity  adenoids cauterized no tissue sent    OB History     Gravida  1   Para      Term      Preterm      AB  1   Living         SAB  1   IAB      Ectopic      Multiple      Live Births               Home Medications    Prior to Admission medications   Medication Sig Start Date End Date Taking?  Authorizing Provider  metroNIDAZOLE (FLAGYL) 500 MG tablet Take 1 tablet (500 mg total) by mouth 2 (two) times daily. 10/25/21  Yes Margarette Canada, NP    Family History Family History  Problem Relation Age of Onset   Hypertension Maternal Grandmother    Diabetes Maternal Grandfather    Thyroid disease Paternal Grandmother     Social History Social History   Tobacco Use   Smoking status: Former    Packs/day: 0.50    Types: E-cigarettes, Cigarettes   Smokeless tobacco: Former   Tobacco comments:    Quit 4-5 months ago   Scientific laboratory technician Use: Every day  Substance Use Topics   Alcohol use: No    Alcohol/week: 0.0 standard drinks of alcohol   Drug use: Not Currently    Types: Marijuana     Allergies   Cat hair extract, Dog fennel allergy skin test, Dust mite extract, Nitrofurantoin, and Latex   Review of Systems Review of Systems  Constitutional:  Negative for fever.  Gastrointestinal:  Negative for abdominal pain and  nausea.  Genitourinary:  Positive for frequency, vaginal discharge and vaginal pain. Negative for dysuria, hematuria and urgency.  Skin:  Negative for rash.  Hematological: Negative.   Psychiatric/Behavioral: Negative.       Physical Exam Triage Vital Signs ED Triage Vitals  Enc Vitals Group     BP 10/25/21 1134 118/70     Pulse Rate 10/25/21 1134 90     Resp --      Temp 10/25/21 1134 98.4 F (36.9 C)     Temp Source 10/25/21 1134 Oral     SpO2 10/25/21 1134 100 %     Weight 10/25/21 1132 179 lb 14.3 oz (81.6 kg)     Height 10/25/21 1132 5\' 8"  (1.727 m)     Head Circumference --      Peak Flow --      Pain Score 10/25/21 1132 0     Pain Loc --      Pain Edu? --      Excl. in GC? --    No data found.  Updated Vital Signs BP 118/70 (BP Location: Left Arm)   Pulse 90   Temp 98.4 F (36.9 C) (Oral)   Ht 5\' 8"  (1.727 m)   Wt 179 lb 14.3 oz (81.6 kg)   LMP 09/14/2021   SpO2 100%   BMI 27.35 kg/m   Visual Acuity Right Eye  Distance:   Left Eye Distance:   Bilateral Distance:    Right Eye Near:   Left Eye Near:    Bilateral Near:     Physical Exam Vitals and nursing note reviewed.  Constitutional:      Appearance: Normal appearance. She is not ill-appearing.  HENT:     Head: Normocephalic and atraumatic.  Cardiovascular:     Rate and Rhythm: Normal rate and regular rhythm.     Pulses: Normal pulses.     Heart sounds: Normal heart sounds. No murmur heard.    No friction rub. No gallop.  Pulmonary:     Effort: Pulmonary effort is normal.     Breath sounds: Normal breath sounds. No wheezing, rhonchi or rales.  Abdominal:     Tenderness: There is no right CVA tenderness or left CVA tenderness.  Skin:    General: Skin is warm and dry.     Capillary Refill: Capillary refill takes less than 2 seconds.     Findings: No erythema or rash.  Neurological:     General: No focal deficit present.     Mental Status: She is alert and oriented to person, place, and time.  Psychiatric:        Mood and Affect: Mood normal.        Behavior: Behavior normal.        Thought Content: Thought content normal.        Judgment: Judgment normal.      UC Treatments / Results  Labs (all labs ordered are listed, but only abnormal results are displayed) Labs Reviewed  WET PREP, GENITAL - Abnormal; Notable for the following components:      Result Value   Clue Cells Wet Prep HPF POC PRESENT (*)    WBC, Wet Prep HPF POC <10 (*)    All other components within normal limits  URINALYSIS, ROUTINE W REFLEX MICROSCOPIC  PREGNANCY, URINE    EKG   Radiology No results found.  Procedures Procedures (including critical care time)  Medications Ordered in UC Medications - No data to display  Initial Impression /  Assessment and Plan / UC Course  I have reviewed the triage vital signs and the nursing notes.  Pertinent labs & imaging results that were available during my care of the patient were reviewed by me and  considered in my medical decision making (see chart for details).   Patient is a nontoxic-appearing 26 year old female here for evaluation of vaginal itchiness and thicker clear vaginal discharge with an odor along with increased urinary frequency.  She denies burning with urination but states that she has burning in her vaginal vault when she urinates.  No blood in her urine and no cloudiness.  No fever, abdominal pain, nausea, or vomiting.  Patient has no CVA tenderness on exam and her abdomen is soft and nontender.  I will order a urinalysis as well as a vaginal wet prep.  I will also urine pregnancy test.  Urinalysis is unremarkable.  Vaginal wet prep is positive for clue cells.  Urine pregnancy test is negative.  I will discharge patient home on metronidazole 500 mg twice daily for 7 days for treatment of bacterial vaginosis.  Final Clinical Impressions(s) / UC Diagnoses   Final diagnoses:  BV (bacterial vaginosis)     Discharge Instructions      Take the Flagyl (metronidazole) 500 mg twice daily for treatment of your bacterial vaginosis.  Avoid alcohol while on the metronidazole as taken together will cause of vomiting.  Bacterial vaginosis is often caused by a imbalance of bacteria in your vaginal vault.  This is sometimes a result of using tampons or hormonal fluctuations during her menstrual cycle.  You if your symptoms are recurrent you can try using a boric acid suppository twice weekly to help maintain the acid-base balance in your vagina vault which could prevent further infection.  You can also try vaginal probiotics to help return normal bacterial balance.      ED Prescriptions     Medication Sig Dispense Auth. Provider   metroNIDAZOLE (FLAGYL) 500 MG tablet Take 1 tablet (500 mg total) by mouth 2 (two) times daily. 14 tablet Becky Augusta, NP      PDMP not reviewed this encounter.   Becky Augusta, NP 10/25/21 1228

## 2021-11-10 ENCOUNTER — Ambulatory Visit
Admission: EM | Admit: 2021-11-10 | Discharge: 2021-11-10 | Disposition: A | Discharge status: Home / Self Care | Payer: Medicaid Other | Attending: Physician Assistant | Admitting: Physician Assistant

## 2021-11-10 ENCOUNTER — Encounter: Payer: Self-pay | Admitting: Emergency Medicine

## 2021-11-10 DIAGNOSIS — B3731 Acute candidiasis of vulva and vagina: Secondary | ICD-10-CM | POA: Insufficient documentation

## 2021-11-10 DIAGNOSIS — R300 Dysuria: Secondary | ICD-10-CM

## 2021-11-10 DIAGNOSIS — N3000 Acute cystitis without hematuria: Secondary | ICD-10-CM | POA: Insufficient documentation

## 2021-11-10 LAB — URINALYSIS, ROUTINE W REFLEX MICROSCOPIC

## 2021-11-10 LAB — WET PREP, GENITAL
Clue Cells Wet Prep HPF POC: NONE SEEN
Sperm: NONE SEEN
Trich, Wet Prep: NONE SEEN
WBC, Wet Prep HPF POC: <10 — AB (ref <10)

## 2021-11-10 LAB — URINALYSIS, MICROSCOPIC (REFLEX)

## 2021-11-10 MED ORDER — FLUCONAZOLE 150 MG PO TABS: ORAL_TABLET | ORAL | 0 refills | Status: DC

## 2021-11-10 MED ORDER — SULFAMETHOXAZOLE-TRIMETHOPRIM 800-160 MG PO TABS: 1.0000 | ORAL_TABLET | Freq: Two times a day (BID) | ORAL | 0 refills | Status: AC

## 2021-11-10 NOTE — Discharge Instructions (Addendum)
VAGINAL INFECTION: The most common types of vaginal infections are yeast infections and bacterial vaginosis. Neither of which are really considered to be sexually transmitted. Often a pH swab or wet prep is performed and if abnormal may reveal either type of infection. Begin metronidazole if prescribed for possible BV infection. If there is concern for yeast infection, fluconazole is often prescribed . Take this as directed. You may also apply topical miconazole (can be purchased OTC) externally for relief of itching. Increase rest and fluid intake. If labs sent out, we will call within 2-5 days with results and amend treatment if necessary. Always try to use pH balanced washes/wipes, urinate after intercourse, stay hydrated, and take probiotics if you are prone to vaginal infections. Return or see PCP or gynecologist for new/worsening infections.    UTI: Based on either symptoms or urinalysis, you may have a urinary tract infection. We will send the urine for culture and call with results in a few days. Begin antibiotics at this time. Your symptoms should be much improved over the next 2-3 days. Increase rest and fluid intake. If for some reason symptoms are worsening or not improving after a couple of days or the urine culture determines the antibiotics you are taking will not treat the infection, the antibiotics may be changed. Return or go to ER for fever, back pain, worsening urinary pain, discharge, increased blood in urine. May take Tylenol or Motrin OTC for pain relief or consider AZO if no contraindications

## 2021-11-10 NOTE — ED Provider Notes (Signed)
[facility]    CSN: [id number] Arrival date & time: 11/10/21  1444      History   Chief Complaint Chief Complaint  Patient presents with   Dysuria   Vaginal Discharge    HPI Cindy Matthews is a 26 y.o. female presenting for dysuria urinary frequency and urgency since yesterday.  She says that she and her boyfriend had unprotected sex recently and she fell asleep and forgot to urinate after intercourse.  Patient denies fever, fatigue, chills, abdominal/pelvic/back/flank pain, hematuria, vaginal discharge or itching, vaginal odor. Patient has history of UTIs and vaginal infections.  Reports a BV infection couple weeks ago.  She has been taking AZO for symptoms.  States that it helps.  No other complaints.  HPI  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    Past Surgical History:  Procedure Laterality Date   NO PAST SURGERIES     NO PAST SURGERIES     TONSILLECTOMY     TONSILLECTOMY AND ADENOIDECTOMY N/A 03/21/2016   Procedure: TONSILLECTOMY AND ADENOIDECTOMY;  Surgeon: Bud Face, MD;  Location: [facility];  Service: ENT;  Laterality: N/A;  Latex sensitivity  adenoids cauterized no tissue sent    OB History     Gravida  1   Para      Term      Preterm      AB  1   Living         SAB  1   IAB      Ectopic      Multiple      Live Births               Home Medications    Prior to Admission medications   Not on File    Family History Family History  Problem Relation Age  of Onset   Hypertension Maternal Grandmother    Diabetes Maternal Grandfather    Thyroid disease Paternal Grandmother     Social History Social History   Tobacco Use   Smoking status: Former    Packs/day: 0.50    Types: E-cigarettes, Cigarettes   Smokeless tobacco: Former   Tobacco comments:    Quit 4-5 months ago   Building services engineer Use: Every day  Substance Use Topics   Alcohol use: No    Alcohol/week: 0.0 standard drinks of alcohol   Drug use: Not Currently    Types: Marijuana     Allergies   Cat hair extract, Dog fennel allergy skin test, Dust mite extract, Nitrofurantoin, and Latex   Review of Systems Review of Systems  Constitutional:  Negative for chills, fatigue and fever.  Gastrointestinal:  Negative for abdominal pain, diarrhea, nausea and vomiting.  Genitourinary:  Positive for dysuria, frequency and urgency. Negative for decreased urine volume, flank pain, hematuria, pelvic pain, vaginal bleeding, vaginal discharge and vaginal pain.  Musculoskeletal:  Negative for back pain.  Skin:  Negative for rash.  Physical Exam Triage Vital Signs ED Triage Vitals [11/10/21 1512]  Enc Vitals Group     BP      Pulse      Resp      Temp      Temp src      SpO2      Weight      Height      Head Circumference      Peak Flow      Pain Score 0     Pain Loc      Pain Edu?      Excl. in [place]?    No data found.  Updated Vital Signs BP 121/71 (BP Location: Right Arm)   Pulse 86   Temp 98 F (36.7 C) (Oral)   Resp 14   Ht 5\' 8"  (1.727 m)   Wt 179 lb (81.2 kg)   LMP 10/31/2021 (Exact Date)   SpO2 100%   BMI 27.22 kg/m    Physical Exam Vitals and nursing note reviewed.  Constitutional:      General: She is not in acute distress.    Appearance: Normal appearance. She is not ill-appearing or toxic-appearing.  HENT:     Head: Normocephalic and atraumatic.  Eyes:     General: No scleral icterus.       Right eye: No discharge.        Left eye: No  discharge.     Conjunctiva/sclera: Conjunctivae normal.  Cardiovascular:     Rate and Rhythm: Normal rate and regular rhythm.     Heart sounds: Normal heart sounds.  Pulmonary:     Effort: Pulmonary effort is normal. No respiratory distress.     Breath sounds: Normal breath sounds.  Abdominal:     Palpations: Abdomen is soft.     Tenderness: There is no abdominal tenderness. There is no right CVA tenderness or left CVA tenderness.  Musculoskeletal:     Cervical back: Neck supple.  Skin:    General: Skin is dry.  Neurological:     General: No focal deficit present.     Mental Status: She is alert. Mental status is at baseline.     Motor: No weakness.     Gait: Gait normal.  Psychiatric:        Mood and Affect: Mood normal.        Behavior: Behavior normal.        Thought Content: Thought content normal.      UC Treatments / Results  Labs (all labs ordered are listed, but only abnormal results are displayed) Labs Reviewed  WET PREP, GENITAL - Abnormal; Notable for the following components:      Result Value   Yeast Wet Prep HPF POC PRESENT (*)    WBC, Wet Prep HPF POC <10 (*)    All other components within normal limits  URINALYSIS, ROUTINE W REFLEX MICROSCOPIC - Abnormal; Notable for the following components:   Color, Urine ORANGE (*)    Glucose, UA   (*)    Value: TEST NOT REPORTED DUE TO COLOR INTERFERENCE OF URINE PIGMENT   Hgb urine dipstick   (*)    Value: TEST NOT REPORTED DUE TO COLOR INTERFERENCE OF URINE PIGMENT   Bilirubin Urine   (*)    Value: TEST NOT REPORTED DUE TO COLOR INTERFERENCE OF URINE PIGMENT   Ketones, ur   (*)    Value: TEST NOT REPORTED DUE TO COLOR INTERFERENCE OF URINE PIGMENT   Protein, ur   (*)  Value: TEST NOT REPORTED DUE TO COLOR INTERFERENCE OF URINE PIGMENT   Nitrite   (*)    Value: TEST NOT REPORTED DUE TO COLOR INTERFERENCE OF URINE PIGMENT   Leukocytes,Ua   (*)    Value: TEST NOT REPORTED DUE TO COLOR INTERFERENCE OF URINE  PIGMENT   All other components within normal limits  URINALYSIS, MICROSCOPIC (REFLEX) - Abnormal; Notable for the following components:   Bacteria, UA MANY (*)    All other components within normal limits  URINE CULTURE    EKG   Radiology No results found.  Procedures Procedures (including critical care time)  Medications Ordered in UC Medications - No data to display  Initial Impression / Assessment and Plan / UC Course  I have reviewed the triage vital signs and the nursing notes.  Pertinent labs & imaging results that were available during my care of the patient were reviewed by me and considered in my medical decision making (see chart for details).   26 year old female presents for dysuria, urinary frequency and urgency for the past day.  Reports unprotected sex with her boyfriend immediately before onset of symptoms.  States that she fell asleep after he had ejaculated inside her.  History of BV, yeast with symptoms, and UTIs.  Taking AZO.  Vitals are stable patient is overall well-appearing.  Patient elects to forego the pelvic exam and perform vaginal self swab.  Urinalysis shows orange urine with the majority of the test not reported.  On the microscopic analysis there are white blood cell clumps, yeast and many bacteria.  Wet prep is positive for yeast.  Discussed all results with patient.  We will send urine for culture and treat for suspected UTI and yeast infection.  Sent Bactrim DS and Diflucan.  Advised her of the importance of making sure to urinate after intercourse and clean up.  Increase her rest and fluid intake.  Reviewed return and ER precautions.   Final Clinical Impressions(s) / UC Diagnoses   Final diagnoses:  Vaginal yeast infection  Acute cystitis without hematuria  Dysuria     Discharge Instructions      VAGINAL INFECTION: The most common types of vaginal infections are yeast infections and bacterial vaginosis. Neither of which are really  considered to be sexually transmitted. Often a pH swab or wet prep is performed and if abnormal may reveal either type of infection. Begin metronidazole if prescribed for possible BV infection. If there is concern for yeast infection, fluconazole is often prescribed . Take this as directed. You may also apply topical miconazole (can be purchased OTC) externally for relief of itching. Increase rest and fluid intake. If labs sent out, we will call within 2-5 days with results and amend treatment if necessary. Always try to use pH balanced washes/wipes, urinate after intercourse, stay hydrated, and take probiotics if you are prone to vaginal infections. Return or see PCP or gynecologist for new/worsening infections.    UTI: Based on either symptoms or urinalysis, you may have a urinary tract infection. We will send the urine for culture and call with results in a few days. Begin antibiotics at this time. Your symptoms should be much improved over the next 2-3 days. Increase rest and fluid intake. If for some reason symptoms are worsening or not improving after a couple of days or the urine culture determines the antibiotics you are taking will not treat the infection, the antibiotics may be changed. Return or go to ER for fever, back pain, worsening  urinary pain, discharge, increased blood in urine. May take Tylenol or Motrin OTC for pain relief or consider AZO if no contraindications      ED Prescriptions   None    PDMP not reviewed this encounter.   Shirlee Latch, PA-C 11/10/21 1610

## 2021-11-10 NOTE — ED Triage Notes (Signed)
Patient reports dysuria that started yesterday.  Patient wants to be checked for UTI and BV.  Patient has been taking AZO.

## 2021-11-12 LAB — URINE CULTURE: Culture: >=100000 — AB

## 2021-11-15 ENCOUNTER — Telehealth: Payer: Self-pay | Admitting: Physician Assistant

## 2021-11-15 MED ORDER — CEPHALEXIN 500 MG PO CAPS: 500.0000 mg | ORAL_CAPSULE | Freq: Two times a day (BID) | ORAL | 0 refills | Status: AC

## 2021-11-15 NOTE — Telephone Encounter (Signed)
Patient with urinary tract infection.  Seen 5 days ago.  Culture positive for Proteus.  Patient has been on Bactrim DS but reports abdominal pain with this.  Changing to Keflex.

## 2021-11-21 ENCOUNTER — Telehealth: Payer: Self-pay | Admitting: Physician Assistant

## 2021-11-21 MED ORDER — FLUCONAZOLE 150 MG PO TABS: 150.0000 mg | ORAL_TABLET | Freq: Every day | ORAL | 0 refills | Status: AC

## 2021-11-21 NOTE — Telephone Encounter (Signed)
Patient reports yeast infection after use of antibiotics.  Sent Diflucan.

## 2021-12-05 DIAGNOSIS — L03811 Cellulitis of head [any part, except face]: Secondary | ICD-10-CM | POA: Insufficient documentation

## 2021-12-06 ENCOUNTER — Other Ambulatory Visit: Payer: Self-pay

## 2021-12-06 ENCOUNTER — Emergency Department
Admission: EM | Admit: 2021-12-06 | Discharge: 2021-12-06 | Disposition: A | Discharge status: Home / Self Care | Payer: Self-pay | Attending: Emergency Medicine | Admitting: Emergency Medicine

## 2021-12-06 DIAGNOSIS — L0390 Cellulitis, unspecified: Secondary | ICD-10-CM

## 2021-12-06 MED ORDER — FLUCONAZOLE 100 MG PO TABS: ORAL_TABLET | ORAL | 0 refills | Status: DC

## 2021-12-06 MED ORDER — CEPHALEXIN 500 MG PO CAPS
500.0000 mg | ORAL_CAPSULE | Freq: Three times a day (TID) | ORAL | 0 refills | Status: AC
Start: 2021-12-06 — End: 2021-12-16

## 2021-12-06 NOTE — ED Triage Notes (Signed)
Pt reports abscess to back of head at hairline. Redness noted. Denies fever or drainage. Reports has improved slightly from onset 4 days ago. Denies hx of same.

## 2021-12-06 NOTE — Discharge Instructions (Addendum)
It looks like you have a little skin infection present in your scalp.  I will give you some Keflex antibiotic take 1 pill 3 times a day.  That should take care of it.  If it gets worse or is not better after about 3 days please come back or get your regular doctor check on you.  Keep the area clean.  Use a hair dryer to dry your hair until this is cleared up. I have given you a prescription for Diflucan.  You can take it 1 at the beginning and 1 at the end of the antibiotics as you usually do.

## 2021-12-06 NOTE — ED Provider Notes (Addendum)
[facility] Provider Note    Event Date/Time   First MD Initiated Contact with Patient 12/06/21 0018     (approximate)   History   Skin Problem   HPI  Cindy Matthews is a 26 y.o. female who complains of a red area on the back of her scalp.  She reports has been there for several days and seems to be improving.      Physical Exam   Triage Vital Signs: ED Triage Vitals [12/06/21 0002]  Enc Vitals Group     BP 120/69     Pulse Rate 83     Resp 18     Temp 98.2 F (36.8 C)     Temp Source Oral     SpO2 99 %     Weight 182 lb (82.6 kg)     Height 5\' 8"  (1.727 m)     Head Circumference      Peak Flow      Pain Score 4     Pain Loc      Pain Edu?      Excl. in [place]?     Most recent vital signs: Vitals:   12/06/21 0002  BP: 120/69  Pulse: 83  Resp: 18  Temp: 98.2 F (36.8 C)  SpO2: 99%     General: Awake, no distress.  Head normocephalic atraumatic except for an area that is about 1 cm on the back of her scalp just below the inion.  This has an area that is about half a centimeter squared inside of it which is scabbed.  There are some small pinpoint areas of redness below that that look like possibly a small folliculitis.  There is one 1 cm node on the left side of the neck posteriorly she is slightly tender and freely mobile CV:  Good peripheral perfusion.  Heart regular rate and rhythm no audible murmurs Resp:  Normal effort.  Lungs are clear Abd:  No distention.     ED Results / Procedures / Treatments   Labs (all labs ordered are listed, but only abnormal results are displayed) Labs Reviewed - No data to display   EKG     RADIOLOGY    PROCEDURES:  Critical Care performed:   Procedures   MEDICATIONS ORDERED IN ED: Medications - No data to display   IMPRESSION / MDM / ASSESSMENT AND PLAN / ED COURSE  I reviewed the triage vital signs and the nursing notes. Patient appears to have a small area of cellulitis  present.  I will try some Keflex on this.  Warned patient to return if she gets worse or not getting any better in about 3 days.  Differential diagnosis includes, but is not limited to, this appears to be most likely an infected scratch and possibly a little bit of folliculitis with 1 enlarged lymph gland.  Patient does not have a fever.  She is not tachycardic or having any other complaints.  Patient's presentation is most consistent with acute, uncomplicated illness.  ----------------------------------------- 12:34 AM on 12/06/2021 ----------------------------------------- Patient asking for some Diflucan.  I will give her some.  She says she usually gets a dose at the beginning and another dose of the end of antibiotics.   FINAL CLINICAL IMPRESSION(S) / ED DIAGNOSES   Final diagnoses:  Cellulitis, unspecified cellulitis site     Rx / DC Orders   ED Discharge Orders          Ordered  cephALEXin (KEFLEX) 500 MG capsule  3 times daily        12/06/21 0028             Note:  This document was prepared using Dragon voice recognition software and may include unintentional dictation errors.   Arnaldo Natal, MD 12/06/21 Leanord Hawking    Arnaldo Natal, MD 12/06/21 [phone]

## 2022-01-01 ENCOUNTER — Ambulatory Visit
Admission: EM | Admit: 2022-01-01 | Discharge: 2022-01-01 | Disposition: A | Discharge status: Home / Self Care | Payer: Medicaid Other | Attending: Internal Medicine | Admitting: Internal Medicine

## 2022-01-01 DIAGNOSIS — B9689 Other specified bacterial agents as the cause of diseases classified elsewhere: Secondary | ICD-10-CM | POA: Insufficient documentation

## 2022-01-01 DIAGNOSIS — B3731 Acute candidiasis of vulva and vagina: Secondary | ICD-10-CM | POA: Insufficient documentation

## 2022-01-01 DIAGNOSIS — N760 Acute vaginitis: Secondary | ICD-10-CM | POA: Diagnosis not present

## 2022-01-01 LAB — WET PREP, GENITAL
Sperm: NONE SEEN
Trich, Wet Prep: NONE SEEN
WBC, Wet Prep HPF POC: <10 — AB (ref <10)

## 2022-01-01 MED ORDER — FLUCONAZOLE 150 MG PO TABS: ORAL_TABLET | ORAL | 0 refills | Status: DC

## 2022-01-01 MED ORDER — METRONIDAZOLE 0.75 % VA GEL: VAGINAL | 0 refills | Status: DC

## 2022-01-01 NOTE — ED Provider Notes (Signed)
[facility]    CSN: [id number] Arrival date & time: 01/01/22  1838      History   Chief Complaint Chief Complaint  Patient presents with   Vaginal Discharge    HPI Cindy Matthews is a 26 y.o. female who presents due to developing strong odor vaginal discharge the day after having sex with a new partner for the first time who did not use a condom, but has known him for a couple of years. She declined STD testing today and will wait for current treatment and see if her vaginal discharge resolves. Had Chlamydia in September this year, and has been fine since, but has not been re-tested. She also used plan B this past weekend.     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    Past Surgical History:  Procedure Laterality Date   NO PAST SURGERIES     NO PAST SURGERIES     TONSILLECTOMY     TONSILLECTOMY AND ADENOIDECTOMY N/A 03/21/2016   Procedure: TONSILLECTOMY AND ADENOIDECTOMY;  Surgeon: Bud Face, MD;  Location: [facility];  Service: ENT;  Laterality: N/A;  Latex sensitivity  adenoids cauterized no tissue sent    OB History     Gravida  1   Para      Term      Preterm      AB  1   Living         SAB  1   IAB      Ectopic      Multiple      Live Births               Home Medications    Prior to Admission medications   Medication Sig Start Date End Date Taking? Authorizing Provider  metroNIDAZOLE (METROGEL) 0.75 % vaginal gel One applicator qhs x 5 nights 01/01/22  Yes  Rodriguez-Southworth, Nettie Elm, PA-C  fluconazole (DIFLUCAN) 150 MG tablet Take 1 tab po q72h for yeast infection 01/01/22   Rodriguez-Southworth, Nettie Elm, PA-C    Family History Family History  Problem Relation Age of Onset   Hypertension Maternal Grandmother    Diabetes Maternal Grandfather    Thyroid disease Paternal Grandmother     Social History Social History   Tobacco Use   Smoking status: Former    Packs/day: 0.50    Types: E-cigarettes, Cigarettes   Smokeless tobacco: Former   Tobacco comments:    Quit 4-5 months ago   Building services engineer Use: Every day  Substance Use Topics   Alcohol use: No    Alcohol/week: 0.0 standard drinks of alcohol   Drug use: Not Currently    Types: Marijuana     Allergies   Cat hair extract, Dog fennel allergy skin test, Dust mite extract, Nitrofurantoin, Sulfa antibiotics, and Latex   Review of Systems Review of Systems  Genitourinary:  Positive for vaginal discharge and vaginal pain. Negative for menstrual problem,  pelvic pain and vaginal bleeding.     Physical Exam Triage Vital Signs ED Triage Vitals  Enc Vitals Group     BP 01/01/22 1953 (!) 148/125     Pulse Rate 01/01/22 1953 95     Resp 01/01/22 1953 18     Temp 01/01/22 1953 98.2 F (36.8 C)     Temp Source 01/01/22 1953 Oral     SpO2 01/01/22 1953 100 %     Weight 01/01/22 1951 175 lb (79.4 kg)     Height 01/01/22 1951 5\' 8"  (1.727 m)     Head Circumference --      Peak Flow --      Pain Score 01/01/22 1950 0     Pain Loc --      Pain Edu? --      Excl. in [place]? --    No data found.  Updated Vital Signs BP (!) 148/125 (BP Location: Left Arm)   Pulse 95   Temp 98.2 F (36.8 C) (Oral)   Resp 18   Ht 5\' 8"  (1.727 m)   Wt 175 lb (79.4 kg)   LMP 12/22/2021   SpO2 100%   BMI 26.61 kg/m   Visual Acuity Right Eye Distance:   Left Eye Distance:   Bilateral Distance:    Right Eye Near:   Left Eye Near:    Bilateral Near:     Physical Exam Vitals and  nursing note reviewed.  Constitutional:      General: She is not in acute distress.    Appearance: She is not toxic-appearing.  HENT:     Right Ear: External ear normal.     Left Ear: External ear normal.  Eyes:     General: No scleral icterus.    Conjunctiva/sclera: Conjunctivae normal.  Pulmonary:     Effort: Pulmonary effort is normal.  Musculoskeletal:     Cervical back: Neck supple.  Skin:    General: Skin is warm and dry.  Neurological:     Mental Status: She is alert and oriented to person, place, and time.     Gait: Gait normal.  Psychiatric:        Mood and Affect: Mood normal.        Behavior: Behavior normal.        Thought Content: Thought content normal.        Judgment: Judgment normal.      UC Treatments / Results  Labs (all labs ordered are listed, but only abnormal results are displayed) Labs Reviewed  WET PREP, GENITAL - Abnormal; Notable for the following components:      Result Value   Yeast Wet Prep HPF POC PRESENT (*)    Clue Cells Wet Prep HPF POC PRESENT (*)    WBC, Wet Prep HPF POC <10 (*)    All other components within normal limits    EKG   Radiology No results found.  Procedures Procedures (including critical care time)  Medications Ordered in UC Medications - No data to display  Initial Impression / Assessment and Plan / UC Course  I have reviewed the triage vital signs and the nursing notes.  Pertinent labs  results that were available during my care of the patient were reviewed by me and considered in my medical decision making (see chart for details).  BV Vaginal yeast infection High risk sexual behavior  I placed her on Diflucan and Metrogel as noted Final Clinical Impressions(s) / UC Diagnoses   Final  diagnoses:  BV (bacterial vaginosis)  Vaginal yeast infection   Discharge Instructions   None    ED Prescriptions     Medication Sig Dispense Auth. Provider   fluconazole (DIFLUCAN) 150 MG tablet Take 1 tab po  q72h for yeast infection 2 tablet Rodriguez-Southworth, Nettie Elm, PA-C   metroNIDAZOLE (METROGEL) 0.75 % vaginal gel One applicator qhs x 5 nights 70 g Rodriguez-Southworth, Nettie Elm, PA-C      PDMP not reviewed this encounter.   Garey Ham, [place] 01/01/22 2053

## 2022-01-01 NOTE — ED Triage Notes (Addendum)
Pt c/o possible BV x2days  Pt recently had unprotected sex on Friday and took a Plan B.  Pt states that her vaginal discharge is yellow in color and has an odor that started on Sunday  Pt wants to discuss possible chlamydia.

## 2022-01-23 ENCOUNTER — Ambulatory Visit: Payer: Medicaid Other

## 2022-01-23 ENCOUNTER — Telehealth: Payer: Self-pay

## 2022-01-23 NOTE — Telephone Encounter (Signed)
Mychart msg sent. AS, CMA 

## 2022-01-24 ENCOUNTER — Ambulatory Visit
Admission: RE | Admit: 2022-01-24 | Discharge: 2022-01-24 | Disposition: A | Discharge status: Home / Self Care | Payer: Medicaid Other | Source: Ambulatory Visit | Attending: Family Medicine | Admitting: Family Medicine

## 2022-01-24 VITALS — BP 124/59 | HR 102 | Temp 99.7°F | Resp 16

## 2022-01-24 DIAGNOSIS — N760 Acute vaginitis: Secondary | ICD-10-CM | POA: Diagnosis not present

## 2022-01-24 DIAGNOSIS — N3000 Acute cystitis without hematuria: Secondary | ICD-10-CM | POA: Diagnosis not present

## 2022-01-24 DIAGNOSIS — B3731 Acute candidiasis of vulva and vagina: Secondary | ICD-10-CM

## 2022-01-24 DIAGNOSIS — B9689 Other specified bacterial agents as the cause of diseases classified elsewhere: Secondary | ICD-10-CM

## 2022-01-24 LAB — URINALYSIS, MICROSCOPIC (REFLEX): WBC, UA: >50 WBC/hpf (ref 0–5)

## 2022-01-24 LAB — URINALYSIS, ROUTINE W REFLEX MICROSCOPIC
Bilirubin Urine: NEGATIVE
Glucose, UA: NEGATIVE mg/dL
Ketones, ur: NEGATIVE mg/dL
Nitrite: NEGATIVE
Protein, ur: 30 mg/dL — AB
Specific Gravity, Urine: 1.01 (ref 1.005–1.030)
pH: 6.5 (ref 5.0–8.0)

## 2022-01-24 LAB — WET PREP, GENITAL
Sperm: NONE SEEN
Trich, Wet Prep: NONE SEEN
WBC, Wet Prep HPF POC: <10 — AB (ref <10)

## 2022-01-24 LAB — PREGNANCY, URINE: Preg Test, Ur: NEGATIVE

## 2022-01-24 MED ORDER — METRONIDAZOLE 500 MG PO TABS: 500.0000 mg | ORAL_TABLET | Freq: Two times a day (BID) | ORAL | 0 refills | Status: DC

## 2022-01-24 MED ORDER — CEPHALEXIN 500 MG PO CAPS: 500.0000 mg | ORAL_CAPSULE | Freq: Four times a day (QID) | ORAL | 0 refills | Status: DC

## 2022-01-24 MED ORDER — METRONIDAZOLE 0.75 % VA GEL: VAGINAL | 0 refills | Status: DC

## 2022-01-24 MED ORDER — FLUCONAZOLE 150 MG PO TABS: ORAL_TABLET | ORAL | 0 refills | Status: DC

## 2022-01-24 MED ORDER — CEPHALEXIN 500 MG PO CAPS: 500.0000 mg | ORAL_CAPSULE | Freq: Four times a day (QID) | ORAL | 0 refills | Status: AC

## 2022-01-24 NOTE — ED Triage Notes (Signed)
Patient also req pregnancy test. Concerned with preg.

## 2022-01-24 NOTE — ED Provider Notes (Signed)
[facility]    CSN: [id number] Arrival date & time: 01/24/22  1703      History   Chief Complaint Chief Complaint  Patient presents with   Possible Pregnancy   Urinary Frequency     HPI HPI Cindy Matthews is a 27 y.o. female.    Cindy Matthews presents for dysuria with hematuria.  Tried flushing her system with a lot of water and AZO cranberry gummies prior to arrival.  Has had antibiotics in last 30 days.   Denies known STI exposure.  Took plan B after unprotected sex. Patient's last menstrual period was 01/10/2022 (approximate).  Wants to be sure she is not pregnant.   - Abnormal vaginal discharge: no new - vaginal bleeding: with wiping - Dysuria: yes - Hematuria:  - Urinary urgency: yes - Urinary frequency: yes - Fever: no - Abdominal pain: yes  - Pelvic pain: no - Rash/Skin lesions/mouth ulcers: no - Nausea: yes - Vomiting: no  - Back Pain: no  - CVA tenderness: yes - Headache: no       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    Past Surgical History:  Procedure Laterality Date   NO PAST SURGERIES     NO PAST SURGERIES     TONSILLECTOMY     TONSILLECTOMY AND ADENOIDECTOMY N/A 03/21/2016   Procedure: TONSILLECTOMY AND ADENOIDECTOMY;  Surgeon: Carloyn Manner, MD;  Location: [place];  Service: ENT;  Laterality: N/A;  Latex sensitivity  adenoids cauterized no tissue sent    OB History     Gravida  1   Para      Term      Preterm      AB  1   Living         SAB  1    IAB      Ectopic      Multiple      Live Births               Home Medications    Prior to Admission medications   Medication Sig Start Date End Date Taking? Authorizing Provider  metroNIDAZOLE (FLAGYL) 500 MG tablet Take 1 tablet (500 mg total) by mouth 2 (two) times daily. 01/24/22  Yes Saman Giddens, DO  cephALEXin (KEFLEX) 500 MG capsule Take 1 capsule (500 mg total) by mouth 4 (four) times daily for 10 days. 01/24/22 02/03/22  Lyndee Hensen, DO  fluconazole (DIFLUCAN) 150 MG tablet Take 1 tab po q72h for yeast infection 01/24/22   Lyndee Hensen, DO  metroNIDAZOLE (METROGEL) 0.75 % vaginal gel One applicator qhs x 5 nights 01/24/22   Lyndee Hensen, DO    Family History Family History  Problem Relation Age of Onset   Hypertension Maternal Grandmother    Diabetes Maternal Grandfather    Thyroid disease Paternal Grandmother     Social History Social History   Tobacco Use   Smoking status: Former  Packs/day: 0.50    Types: E-cigarettes, Cigarettes   Smokeless tobacco: Former   Tobacco comments:    Quit 4-5 months ago   Building services engineer Use: Every day  Substance Use Topics   Alcohol use: No    Alcohol/week: 0.0 standard drinks of alcohol   Drug use: Not Currently    Types: Marijuana     Allergies   Cat hair extract, Dog fennel allergy skin test, Dust mite extract, Nitrofurantoin, Sulfa antibiotics, and Latex   Review of Systems Review of Systems: :negative unless otherwise stated in HPI.      Physical Exam Triage Vital Signs ED Triage Vitals  Enc Vitals Group     BP 01/24/22 1726 (!) 124/59     Pulse Rate 01/24/22 1726 (!) 102     Resp 01/24/22 1726 16     Temp 01/24/22 1726 99.7 F (37.6 C)     Temp Source 01/24/22 1726 Oral     SpO2 01/24/22 1726 98 %     Weight --      Height --      Head Circumference --      Peak Flow --      Pain Score 01/24/22 1725 0     Pain Loc --      Pain Edu? --      Excl. in GC? --    No data  found.  Updated Vital Signs BP (!) 124/59 (BP Location: Right Arm)   Pulse (!) 102   Temp 99.7 F (37.6 C) (Oral)   Resp 16   LMP 01/10/2022 (Approximate)   SpO2 98%   Visual Acuity Right Eye Distance:   Left Eye Distance:   Bilateral Distance:    Right Eye Near:   Left Eye Near:    Bilateral Near:     Physical Exam GEN: well appearing female in no acute distress  CVS: well perfused  RESP: speaking in full sentences without pause  ABD: soft, non-tender, non-distended, no palpable masses, + left CVA tenderness  GU: deferred, patient performed self swab  SKIN: warm, dry    UC Treatments / Results  Labs (all labs ordered are listed, but only abnormal results are displayed) Labs Reviewed  WET PREP, GENITAL - Abnormal; Notable for the following components:      Result Value   Yeast Wet Prep HPF POC PRESENT (*)    Clue Cells Wet Prep HPF POC PRESENT (*)    WBC, Wet Prep HPF POC <10 (*)    All other components within normal limits  URINALYSIS, ROUTINE W REFLEX MICROSCOPIC - Abnormal; Notable for the following components:   APPearance HAZY (*)    Hgb urine dipstick MODERATE (*)    Protein, ur 30 (*)    Leukocytes,Ua LARGE (*)    All other components within normal limits  URINALYSIS, MICROSCOPIC (REFLEX) - Abnormal; Notable for the following components:   Bacteria, UA FEW (*)    All other components within normal limits  URINE CULTURE  PREGNANCY, URINE    EKG   Radiology No results found.  Procedures Procedures (including critical care time)  Medications Ordered in UC Medications - No data to display  Initial Impression / Assessment and Plan / UC Course  I have reviewed the triage vital signs and the nursing notes.  Pertinent labs & imaging results that were available during my care of the patient were reviewed by me and considered in my medical decision making (see chart for details).  Patient is a 27 y.o.Marland Kitchen female  who presents for dysuria with  urinary frequency and urinary urgency.  Overall patient is well-appearing and afebrile.  Vital signs stable.  UA consistent with pyrua. She has an elevated temperature here with nausea concerning for pyelonephritis. Hematuria not supported on microscopy.  Treat with Keflex 4 times daily for 10 days.    Urine culture obtained.  Follow-up sensitivities and change antibiotics, if needed. Previous urine culture had Proteus mirabilis I do not see any renal imaging, on chart review. Advised to follow up with PCP or possibly see a urologist if continues to get frequent UTIs. Yeast vaginitis and bacterial vaginitis on wet prep. Discussed suppressive therapy with Boric acid or metrogel.  Treat with Diflucan and metronidazole as below.   Return precautions including abdominal pain, fever, chills, nausea, or vomiting given. Discussed MDM, treatment plan and plan for follow-up with patient who agrees with plan.        Final Clinical Impressions(s) / UC Diagnoses   Final diagnoses:  Acute cystitis without hematuria  BV (bacterial vaginosis)  Yeast vaginitis     Discharge Instructions      You had evidence of of a bacterial and yeast infection today.  Stop by the pharmacy to pick up your prescriptions.  For your UTI: Take Keflex 4times a day for the next 10 days  For BV/bacterial vaginosis: Take metronidazole twice a day for the next 7 days.  Do not drink any alcohol with taking this medication.  For yeast infection: Take the first dose of Diflucan on day 3 and after you complete your antibiotics take the last dose.  If your symptoms do not improve in the next 7 days, be sure to follow-up here or at your primary care provider office.  Go to the emergency department if you are having increasing pain, worsening vaginal bleeding or fever.     ED Prescriptions     Medication Sig Dispense Auth. Provider   fluconazole (DIFLUCAN) 150 MG tablet Take 1 tab po q72h for yeast infection 2 tablet Shantea Poulton,  Slate Debroux, DO   cephALEXin (KEFLEX) 500 MG capsule Take 1 capsule (500 mg total) by mouth 4 (four) times daily for 10 days. 40 capsule Jode Lippe, DO   metroNIDAZOLE (FLAGYL) 500 MG tablet Take 1 tablet (500 mg total) by mouth 2 (two) times daily. 14 tablet Linh Hedberg, Ronnette Juniper, DO      PDMP not reviewed this encounter.   Lyndee Hensen, DO 01/24/22 1812

## 2022-01-24 NOTE — ED Triage Notes (Signed)
Patient presents to UC for urinary req since Sunday. Took a dose with azo gummy and cranberry juice. Hx of Bv and yeast.

## 2022-01-24 NOTE — Discharge Instructions (Addendum)
You had evidence of of a bacterial and yeast infection today.  Stop by the pharmacy to pick up your prescriptions.  For your UTI: Take Keflex 4times a day for the next 10 days  For BV/bacterial vaginosis: Take metronidazole twice a day for the next 7 days.  Do not drink any alcohol with taking this medication.  For yeast infection: Take the first dose of Diflucan on day 3 and after you complete your antibiotics take the last dose.  If your symptoms do not improve in the next 7 days, be sure to follow-up here or at your primary care provider office.  Go to the emergency department if you are having increasing pain, worsening vaginal bleeding or fever.

## 2022-01-26 LAB — URINE CULTURE: Culture: 80000 — AB

## 2022-03-12 ENCOUNTER — Encounter: Payer: Self-pay | Admitting: Physician Assistant

## 2022-03-12 ENCOUNTER — Ambulatory Visit (INDEPENDENT_AMBULATORY_CARE_PROVIDER_SITE_OTHER): Payer: Medicaid Other | Admitting: Physician Assistant

## 2022-03-12 VITALS — BP 112/50 | Ht 67.75 in | Wt 196.0 lb

## 2022-03-12 DIAGNOSIS — K219 Gastro-esophageal reflux disease without esophagitis: Secondary | ICD-10-CM

## 2022-03-12 DIAGNOSIS — Z124 Encounter for screening for malignant neoplasm of cervix: Secondary | ICD-10-CM | POA: Diagnosis not present

## 2022-03-12 DIAGNOSIS — F419 Anxiety disorder, unspecified: Secondary | ICD-10-CM | POA: Diagnosis not present

## 2022-03-12 DIAGNOSIS — Z0000 Encounter for general adult medical examination without abnormal findings: Secondary | ICD-10-CM

## 2022-03-12 DIAGNOSIS — J4520 Mild intermittent asthma, uncomplicated: Secondary | ICD-10-CM

## 2022-03-12 DIAGNOSIS — Z30011 Encounter for initial prescription of contraceptive pills: Secondary | ICD-10-CM

## 2022-03-12 LAB — POCT URINE PREGNANCY: Preg Test, Ur: NEGATIVE

## 2022-03-12 MED ORDER — ALBUTEROL SULFATE HFA 108 (90 BASE) MCG/ACT IN AERS: 2.0000 | INHALATION_SPRAY | Freq: Four times a day (QID) | RESPIRATORY_TRACT | 2 refills | Status: DC | PRN

## 2022-03-12 MED ORDER — OMEPRAZOLE 20 MG PO CPDR: 20.0000 mg | DELAYED_RELEASE_CAPSULE | Freq: Every day | ORAL | 3 refills | Status: DC

## 2022-03-12 MED ORDER — LO LOESTRIN FE 1 MG-10 MCG / 10 MCG PO TABS: 1.0000 | ORAL_TABLET | Freq: Every day | ORAL | 11 refills | Status: DC

## 2022-03-12 MED ORDER — BUSPIRONE HCL 7.5 MG PO TABS: 7.5000 mg | ORAL_TABLET | Freq: Two times a day (BID) | ORAL | 0 refills | Status: DC

## 2022-03-12 NOTE — Progress Notes (Unsigned)
Cindy Matthews,Sha'taria Tyson,acting as a Education administrator for [employer], PA-C.,have documented all relevant documentation on the behalf of Cindy Speak, PA-C,as directed by  [employer], PA-C while in the presence of [employer], PA-C.   New patient visit   [name]   DOB: [birth year]   26 y.o. Female  MRN: [id number] Visit Date: 03/12/2022  Today's healthcare provider: [name], PA-C   No chief complaint on file.  Subjective    Cindy Matthews is a 27 y.o. female who presents today as a new patient to establish care.  HPI  - Transitioned from [facility] and has being seen here after Last was seen in 5 years ago  Works at [employer] ot cold not allergies, GERD 3 times week, wakes , chest burning, burp at the back of the throat Eczema   Healthcare maintenance -Pap Smear: Never received; would like to go to OBGYN (prefers [facility]) -Flu Vaccine: declined -Hepatitis C Screening: yes -Tetanus Vaccine: receive today -Patient would like to see about contraceptives and discuss possible adhd medication. States she was diagnosed when she was in school and has been on and off medication from the age of [age] and feel like it is becoming a issue again and causing her problems with keeping up at work. Patient is interested in contraceptive pills, previously taken depo and not interested in anything that she would have to imbed in skin or uterus     03/12/2022    2:21 PM  GAD 7 : Generalized Anxiety Score  Nervous, Anxious, on Edge 1  Control/stop worrying 2  Worry too much - different things 3  Trouble relaxing 2  Restless 2  Easily annoyed or irritable 3  Afraid - awful might happen 3  Total GAD 7 Score 16  Anxiety Difficulty Very difficult   Adult ADHD Self Report Scale (most recent)     Adult ADHD Self-Report Scale (ASRS-v1.1) Symptom Checklist - 03/12/22 1400       Part A   1. How often do you have trouble wrapping up the final details of a  project, once the challenging parts have been done? Sometimes  2. How often do you have difficulty getting things done in order when you have to do a task that requires organization? Often    3. How often do you have problems remembering appointments or obligations? Often  4. When you have a task that requires a lot of thought, how often do you avoid or delay getting started? Very Often    5. How often do you fidget or squirm with your hands or feet when you have to sit down for a long time? Very Often  6. How often do you feel overly active and compelled to do things, like you were driven by a motor? Sometimes      Part B   7. How often do you make careless mistakes when you have to work on a boring or difficult project? Often  8. How often do you have difficulty keeping your attention when you are doing boring or repetitive work? Very Often    9. How often do you have difficulty concentrating on what people say to you, even when they are speaking to you directly? Sometimes  10. How often do you misplace or have difficulty finding things at home or at work? Very Often    20. How often are you distracted by activity or noise around you? Often  12. How often do  you leave your seat in meetings or other situations in which you are expected to remain seated? Very Often    71. How often do you feel restless or fidgety? Very Often  47. How often do you have difficulty unwinding and relaxing when you have time to yourself? Often    15. How often do you find yourself talking too much when you are in social situations? Very Often  63. When you are in a conversation, how often do you find yourself finishing the sentences of the people you are talking to, before they can finish them themselves? Sometimes    17. How often do you have difficulty waiting your turn in situations when turn taking is required? Sometimes  18. How often do you interrupt others when they are busy? Often      Comment   How old were you when  these problems first began to occur? --   about [age]                Past Medical History:  Diagnosis Date   ADD (attention deficit disorder)    No meds since 2016   Anxiety    Asthma    BMI 28.0-28.9,adult    Eczema    GERD (gastroesophageal reflux disease)    Motion sickness    curvy [address]   Past Surgical History:  Procedure Laterality Date   NO PAST SURGERIES     NO PAST SURGERIES     TONSILLECTOMY     TONSILLECTOMY AND ADENOIDECTOMY N/A 03/21/2016   Procedure: TONSILLECTOMY AND ADENOIDECTOMY;  Surgeon: Carloyn Manner, MD;  Location: [place];  Service: ENT;  Laterality: N/A;  Latex sensitivity  adenoids cauterized no tissue sent   Family Status  Relation Name Status   Mother  Alive   Father  Alive   Sister  Alive   MGM  Alive   MGF  Alive   PGM  Alive   PGF  Alive   Family History  Problem Relation Age of Onset   Asthma Sister    Anxiety disorder Sister    Hypertension Maternal Grandmother    Diabetes Maternal Grandfather    Thyroid disease Paternal Grandmother    Social History   Socioeconomic History   Marital status: Single    Spouse name: Not on file   Number of children: Not on file   Years of education: Not on file   Highest education level: Not on file  Occupational History   Not on file  Tobacco Use   Smoking status: Every Day    Packs/day: 0.50    Types: E-cigarettes, Cigarettes    Last attempt to quit: 01/15/2018    Years since quitting: 4.1   Smokeless tobacco: Former   Tobacco comments:    Quit 4-5 months ago   Vaping Use   Vaping Use: Every day   Start date: 01/16/2019  Substance and Sexual Activity   Alcohol use: No    Alcohol/week: 0.0 standard drinks of alcohol   Drug use: Not Currently    Types: Marijuana   Sexual activity: Yes    Birth control/protection: None  Other Topics Concern   Not on file  Social History Narrative   Not on file   Social Determinants of Health   Financial Resource Strain: Not  on file  Food Insecurity: Not on file  Transportation Needs: Not on file  Physical Activity: Not on file  Stress: Not on file  Social Connections:  Not on file   Outpatient Medications Prior to Visit  Medication Sig   fluconazole (DIFLUCAN) 150 MG tablet Take 1 tab po q72h for yeast infection (Patient not taking: Reported on 03/12/2022)   metroNIDAZOLE (FLAGYL) 500 MG tablet Take 1 tablet (500 mg total) by mouth 2 (two) times daily. (Patient not taking: Reported on 03/12/2022)   metroNIDAZOLE (METROGEL) 0.75 % vaginal gel One applicator qhs x 5 nights (Patient not taking: Reported on 03/12/2022)   No facility-administered medications prior to visit.   Allergies  Allergen Reactions   Cat Hair Extract Itching    Other reaction(s): Sneezing Watery eyes   Dog Fennel Allergy Skin Test    Dust Mite Extract    Nitrofurantoin Nausea Only   Sulfa Antibiotics Other (See Comments)    Kidney pain and seasonal allergy symptoms   Latex Rash    Immunization History  Administered Date(s) Administered   DTaP 12/07/1997, 02/07/1998, 04/05/1998, 12/05/1998, 11/06/2001   HIB (PRP-OMP) 12/07/1997, 02/07/1998, 12/05/1998   HPV Quadrivalent 03/18/2013   Hepatitis A 05/26/2007, 12/20/2008   Hepatitis A, Ped/Adol-2 Dose 05/26/2007, 12/20/2008   Hepatitis B 04/05/1998, 06/07/1998, 03/23/1999   Hepatitis B, PED/ADOLESCENT 04/05/1998, 06/07/1998, 03/23/1999   Hpv-Unspecified 11/02/2010, 07/27/2011   IPV 12/07/1997, 02/07/1998, 09/08/1998, 11/06/2001   Influenza-Unspecified 11/02/2010   MMR 09/08/1998, 11/06/2001   Meningococcal Conjugate 07/27/2011   Pneumococcal Conjugate PCV 7 12/05/1998, 03/23/1999   Tdap 12/20/2008   Varicella 09/08/1998, 05/26/2007    Health Maintenance  Topic Date Due   COVID-19 Vaccine (1) Never done   Hepatitis C Screening  Never done   PAP-Cervical Cytology Screening  Never done   PAP SMEAR-Modifier  Never done   DTaP/Tdap/Td (7 - Td or Tdap) 12/21/2018   INFLUENZA  VACCINE  08/15/2021   HPV VACCINES  Completed   HIV Screening  Completed    Patient Care Team: Cindy Speak, PA-C as PCP - General (Physician Assistant)  Review of Systems  All other systems reviewed and are negative. Except see HPI   {Labs  Heme  Chem  Endocrine  Serology  Results Review (optional):23779}   Objective    BP (!) 112/50 (BP Location: Left Arm, Patient Position: Sitting, Cuff Size: Large)   Ht 5' 7.75" (1.721 m)   Wt 196 lb (88.9 kg)   LMP 02/22/2022   SpO2 100%   BMI 30.02 kg/m  {Show previous vital signs (optional):23777}  Physical Exam ***  Depression Screen    05/14/2017    9:12 AM  PHQ 2/9 Scores  PHQ - 2 Score 1   No results found for any visits on 03/12/22.  Assessment & Plan      1. Cervical cancer screening She is overdue but prefers to PAP smear at the [facility] office - Ambulatory referral to Obstetrics / Gynecology for annual exam and pap smear Will FU  2. Encounter for initial prescription of contraceptive pills Encounter for initial prescription of contraceptive pills Educated on the topic, and discussed the options. Noted the LARC's are often the contraceptive method of choice, and discussed IUDs with her.  She really did not want to pursue that presently. Discussed Nexplanon as an alternative, although she did not want to pursue that either. Discussed the oral contraceptive entities, and she felt comfortable with this and wanted to utilize this for her contraceptive method.  Noted the risk/benefits of these medicines, felt to be relatively safe and she has no concerning past medical history to increased risk concerns. Noted there are many choices of  oral contraceptives entities and will add a Junel product, 1 with iron as Cindy Matthews think that can be a helpful benefit, and assess her response.  Noted she does not need to have a Pap or pelvic exam prior to initiating. Discussed starting either the Sunday after her last menstrual period or can  do a quick start, starting when she feels the prescription.  Discussed both options with her and is up to her house she wants to start. Did note when she turns 23, she will need her first Pap and pelvic exam if not needed sooner, and she was understanding of that.  Counseled on the use of OCs, and questions answered, and information also provided in the AVS. A prescription was sent to her pharmacy    - Norethindrone-Ethinyl Estradiol-Fe Biphas (LO LOESTRIN FE) 1 MG-10 MCG / 10 MCG tablet; Take 1 tablet by mouth daily. Initiation, begin on day 1 of menstrual cycle; if started on a different day, use a non-hormonal contraceptive as back-up during first 7 days. May begin no earlier than 4 weeks postpartum if not breastfeeding or 4 weeks after a second trimester abortion. May begin immediately after first-trimester abortion or miscarriage  Dispense: 28 tablet; Refill: 11 - POCT urine pregnancy  3. Anxiety Chronic and unstable     03/12/2022    2:20 PM 05/14/2017    9:12 AM  PHQ9 SCORE ONLY  PHQ-9 Total Score 6 1       03/12/2022    2:21 PM  GAD 7 : Generalized Anxiety Score  Nervous, Anxious, on Edge 1  Control/stop worrying 2  Worry too much - different things 3  Trouble relaxing 2  Restless 2  Easily annoyed or irritable 3  Afraid - awful might happen 3  Total GAD 7 Score 16  Anxiety Difficulty Very difficult     - Ambulatory referral to Psychiatry - Ambulatory referral to Psychology for ADHD evaluation. Per pt she was diagnosed for adhd when she was a child - busPIRone (BUSPAR) 7.5 MG tablet; Take 1 tablet (7.5 mg total) by mouth 2 (two) times daily.  Dispense: 30 tablet; Refill: 0 Will reassess in a mo  4. Gastroesophageal reflux disease without esophagitis Chronic and unstable OTC medications were unhelpful Trial of Prilosec advised - omeprazole (PRILOSEC) 20 MG capsule; Take 1 capsule (20 mg total) by mouth daily.  Dispense: 30 capsule; Refill: 3 Will reassess at her next  FU  5. Intermittent asthma without complication, unspecified asthma severity Chronic and stable  Has hx of eczema, seasonal allergies Needs albuterol inhaler when she has an episode of cough, wheezing or SOB: - albuterol (VENTOLIN HFA) 108 (90 Base) MCG/ACT inhaler; Inhale 2 puffs into the lungs every 6 (six) hours as needed for wheezing or shortness of breath.  Dispense: 8 g; Refill: 2 Will Fu Will need PFTs by Allergy  6. Encounter for medical examination to establish care Welcomed to our clinic Reviewed past medical hx, social hx, family hx and surgical hx Including her vaccination records  Pt needs Tdap but has to refuse as her grandmother has been waiting for her in her car Pt refused to do any labs today except urine pregnancy test.  Return in about 4 weeks (around 04/09/2022) for chronic disease f/u.     The patient was advised to call back or seek an in-person evaluation if the symptoms worsen or if the condition fails to improve as anticipated.  Cindy Matthews discussed the assessment and treatment plan with the patient. The  patient was provided an opportunity to ask questions and all were answered. The patient agreed with the plan and demonstrated an understanding of the instructions.  Cindy Matthews, Cindy Speak, PA-C have reviewed all documentation for this visit. The documentation on  03/12/22  for the exam, diagnosis, procedures, and orders are all accurate and complete.  Cindy Matthews, [facility], [place] [phone] (phone) [phone] (fax)  [place]

## 2022-03-13 ENCOUNTER — Encounter: Payer: Self-pay | Admitting: Physician Assistant

## 2022-03-22 ENCOUNTER — Ambulatory Visit: Payer: Medicaid Other

## 2022-04-09 ENCOUNTER — Encounter: Payer: Medicaid Other | Admitting: Obstetrics

## 2022-04-09 NOTE — Progress Notes (Deleted)
GYNECOLOGY ANNUAL PHYSICAL EXAM PROGRESS NOTE  Subjective:    Cindy Matthews is a 27 y.o. G16P0010 female who presents for an annual exam. The patient has no complaints today. The patient {is/is not/has never been:13135} sexually active. The patient participates in regular exercise: {yes/no/not asked:9010}. Has the patient ever been transfused or tattooed?: {yes/no/not asked:9010}. The patient reports that there {is/is not:9024} domestic violence in her life.    Menstrual History: Menarche age: *** Patient's last menstrual period was 02/22/2022.     Gynecologic History:  Contraception: {method:5051} History of STI's:  Last Pap: ***. Results were: {norm/abn:16337}.  ***Denies/Notes h/o abnormal pap smears. Last mammogram: ***. Results were: {norm/abn:16337}       OB History  Gravida Para Term Preterm AB Living  1 0 0 0 1 0  SAB IAB Ectopic Multiple Live Births  1 0 0 0 0    # Outcome Date GA Lbr Len/2nd Weight Sex Delivery Anes PTL Lv  1 SAB 03/2017            Past Medical History:  Diagnosis Date   ADD (attention deficit disorder)    No meds since 2016   Anxiety    Asthma    BMI 28.0-28.9,adult    Eczema    GERD (gastroesophageal reflux disease)    Motion sickness    [address]    Past Surgical History:  Procedure Laterality Date   NO PAST SURGERIES     NO PAST SURGERIES     TONSILLECTOMY     TONSILLECTOMY AND ADENOIDECTOMY N/A 03/21/2016   Procedure: TONSILLECTOMY AND ADENOIDECTOMY;  Surgeon: Carloyn Manner, MD;  Location: [place];  Service: ENT;  Laterality: N/A;  Latex sensitivity  adenoids cauterized no tissue sent    Family History  Problem Relation Age of Onset   Asthma Sister    Anxiety disorder Sister    Hypertension Maternal Grandmother    Diabetes Maternal Grandfather    Thyroid disease Paternal Grandmother     Social History   Socioeconomic History   Marital status: Single    Spouse name: Not on file   Number of  children: Not on file   Years of education: Not on file   Highest education level: Not on file  Occupational History   Not on file  Tobacco Use   Smoking status: Every Day    Packs/day: .5    Types: E-cigarettes, Cigarettes    Last attempt to quit: 01/15/2018    Years since quitting: 4.2   Smokeless tobacco: Former   Tobacco comments:    Quit 4-5 months ago   Vaping Use   Vaping Use: Every day   Start date: 01/16/2019  Substance and Sexual Activity   Alcohol use: No    Alcohol/week: 0.0 standard drinks of alcohol   Drug use: Not Currently    Types: Marijuana   Sexual activity: Yes    Birth control/protection: None  Other Topics Concern   Not on file  Social History Narrative   Not on file   Social Determinants of Health   Financial Resource Strain: Not on file  Food Insecurity: Not on file  Transportation Needs: No Transportation Needs (03/19/2022)   PRAPARE - Hydrologist (Medical): No    Lack of Transportation (Non-Medical): No  Physical Activity: Not on file  Stress: Not on file  Social Connections: Not on file  Intimate Partner Violence: Not on file    Current Outpatient  Medications on File Prior to Visit  Medication Sig Dispense Refill   albuterol (VENTOLIN HFA) 108 (90 Base) MCG/ACT inhaler Inhale 2 puffs into the lungs every 6 (six) hours as needed for wheezing or shortness of breath. 8 g 2   busPIRone (BUSPAR) 7.5 MG tablet Take 1 tablet (7.5 mg total) by mouth 2 (two) times daily. 30 tablet 0   fluconazole (DIFLUCAN) 150 MG tablet Take 1 tab po q72h for yeast infection (Patient not taking: Reported on 03/12/2022) 2 tablet 0   metroNIDAZOLE (FLAGYL) 500 MG tablet Take 1 tablet (500 mg total) by mouth 2 (two) times daily. (Patient not taking: Reported on 03/12/2022) 14 tablet 0   metroNIDAZOLE (METROGEL) 0.75 % vaginal gel One applicator qhs x 5 nights (Patient not taking: Reported on 03/12/2022) 70 g 0   Norethindrone-Ethinyl Estradiol-Fe  Biphas (LO LOESTRIN FE) 1 MG-10 MCG / 10 MCG tablet Take 1 tablet by mouth daily. Initiation, begin on day 1 of menstrual cycle; if started on a different day, use a non-hormonal contraceptive as back-up during first 7 days. May begin no earlier than 4 weeks postpartum if not breastfeeding or 4 weeks after a second trimester abortion. May begin immediately after first-trimester abortion or miscarriage 28 tablet 11   omeprazole (PRILOSEC) 20 MG capsule Take 1 capsule (20 mg total) by mouth daily. 30 capsule 3   No current facility-administered medications on file prior to visit.    Allergies  Allergen Reactions   Cat Hair Extract Itching    Other reaction(s): Sneezing Watery eyes   Dog Fennel Allergy Skin Test    Dust Mite Extract    Nitrofurantoin Nausea Only   Sulfa Antibiotics Other (See Comments)    Kidney pain and seasonal allergy symptoms   Latex Rash     Review of Systems Constitutional: negative for chills, fatigue, fevers and sweats Eyes: negative for irritation, redness and visual disturbance Ears, nose, mouth, throat, and face: negative for hearing loss, nasal congestion, snoring and tinnitus Respiratory: negative for asthma, cough, sputum Cardiovascular: negative for chest pain, dyspnea, exertional chest pressure/discomfort, irregular heart beat, palpitations and syncope Gastrointestinal: negative for abdominal pain, change in bowel habits, nausea and vomiting Genitourinary: negative for abnormal menstrual periods, genital lesions, sexual problems and vaginal discharge, dysuria and urinary incontinence Integument/breast: negative for breast lump, breast tenderness and nipple discharge Hematologic/lymphatic: negative for bleeding and easy bruising Musculoskeletal:negative for back pain and muscle weakness Neurological: negative for dizziness, headaches, vertigo and weakness Endocrine: negative for diabetic symptoms including polydipsia, polyuria and skin  dryness Allergic/Immunologic: negative for hay fever and urticaria      Objective:  Last menstrual period 02/22/2022. There is no height or weight on file to calculate BMI.    General Appearance:    Alert, cooperative, no distress, appears stated age  Head:    Normocephalic, without obvious abnormality, atraumatic  Eyes:    PERRL, conjunctiva/corneas clear, EOM's intact, both eyes  Ears:    Normal external ear canals, both ears  Nose:   Nares normal, septum midline, mucosa normal, no drainage or sinus tenderness  Throat:   Lips, mucosa, and tongue normal; teeth and gums normal  Neck:   Supple, symmetrical, trachea midline, no adenopathy; thyroid: no enlargement/tenderness/nodules; no carotid bruit or JVD  Back:     Symmetric, no curvature, ROM normal, no CVA tenderness  Lungs:     Clear to auscultation bilaterally, respirations unlabored  Chest Wall:    No tenderness or deformity   Heart:  Regular rate and rhythm, S1 and S2 normal, no murmur, rub or gallop  Breast Exam:    No tenderness, masses, or nipple abnormality  Abdomen:     Soft, non-tender, bowel sounds active all four quadrants, no masses, no organomegaly.    Genitalia:    Pelvic:external genitalia normal, vagina without lesions, discharge, or tenderness, rectovaginal septum  normal. Cervix normal in appearance, no cervical motion tenderness, no adnexal masses or tenderness.  Uterus normal size, shape, mobile, regular contours, nontender.  Rectal:    Normal external sphincter.  No hemorrhoids appreciated. Internal exam not done.   Extremities:   Extremities normal, atraumatic, no cyanosis or edema  Pulses:   2+ and symmetric all extremities  Skin:   Skin color, texture, turgor normal, no rashes or lesions  Lymph nodes:   Cervical, supraclavicular, and axillary nodes normal  Neurologic:   CNII-XII intact, normal strength, sensation and reflexes throughout   .  Labs:  Lab Results  Component Value Date   WBC 7.7 05/03/2021    HGB 11.4 (L) 05/03/2021   HCT 36.5 05/03/2021   MCV 81.7 05/03/2021   PLT 325 05/03/2021    Lab Results  Component Value Date   CREATININE 0.72 05/03/2021   BUN 13 05/03/2021   NA 139 05/03/2021   K 4.3 05/03/2021   CL 107 05/03/2021   CO2 25 05/03/2021    Lab Results  Component Value Date   ALT 17 05/03/2021   AST 19 05/03/2021   ALKPHOS 69 05/03/2021   BILITOT 0.5 05/03/2021    No results found for: "TSH"   Assessment:   No diagnosis found.   Plan:  Blood tests: {blood tests:13147}. Breast self exam technique reviewed and patient encouraged to perform self-exam monthly. Contraception: {contraceptive methods:5051}. Discussed healthy lifestyle modifications. Mammogram {discussed/ordered:14545} Pap smear {discussed/ordered:14545}. COVID vaccination status: Follow up in 1 year for annual exam   Chilton Greathouse, [facility]

## 2022-04-18 ENCOUNTER — Ambulatory Visit: Payer: Self-pay | Admitting: Physician Assistant

## 2022-04-23 ENCOUNTER — Encounter: Payer: Self-pay | Admitting: Physician Assistant

## 2022-04-23 DIAGNOSIS — F419 Anxiety disorder, unspecified: Secondary | ICD-10-CM

## 2022-04-24 MED ORDER — BUSPIRONE HCL 7.5 MG PO TABS: 7.5000 mg | ORAL_TABLET | Freq: Two times a day (BID) | ORAL | 0 refills | Status: DC

## 2022-05-21 ENCOUNTER — Ambulatory Visit (INDEPENDENT_AMBULATORY_CARE_PROVIDER_SITE_OTHER): Payer: Medicaid Other | Admitting: Physician Assistant

## 2022-05-21 VITALS — BP 120/65 | HR 105 | Temp 98.4°F | Wt 201.0 lb

## 2022-05-21 DIAGNOSIS — E669 Obesity, unspecified: Secondary | ICD-10-CM

## 2022-05-21 DIAGNOSIS — K219 Gastro-esophageal reflux disease without esophagitis: Secondary | ICD-10-CM

## 2022-05-21 DIAGNOSIS — J4520 Mild intermittent asthma, uncomplicated: Secondary | ICD-10-CM | POA: Diagnosis not present

## 2022-05-21 DIAGNOSIS — F419 Anxiety disorder, unspecified: Secondary | ICD-10-CM

## 2022-05-21 DIAGNOSIS — D509 Iron deficiency anemia, unspecified: Secondary | ICD-10-CM | POA: Diagnosis not present

## 2022-05-21 MED ORDER — BUSPIRONE HCL 7.5 MG PO TABS: 7.5000 mg | ORAL_TABLET | Freq: Two times a day (BID) | ORAL | 3 refills | Status: DC

## 2022-05-21 NOTE — Progress Notes (Signed)
Established patient visit  [name]   DOB: [birth year]   27 y.o. Female  MRN: [id number] Visit Date: 05/21/2022  Today's healthcare provider: Debera Lat, PA-C   No chief complaint on file.  Subjective    HPI  Anxiety, Follow-up  She was last seen for anxiety 2 months ago. Changes made at last visit include none.   She feels her anxiety is moderate and Worse since last visit.  Symptoms: No chest pain Yes difficulty concentrating  No dizziness No fatigue  No feelings of losing control Yes insomnia  Yes irritable No palpitations  No panic attacks No racing thoughts  No shortness of breath No sweating  Yes tremors/shakes    GAD-7 Results    05/21/2022    3:16 PM 03/12/2022    2:21 PM  GAD-7 Generalized Anxiety Disorder Screening Tool  1. Feeling Nervous, Anxious, or on Edge 2 1  2. Not Being Able to Stop or Control Worrying 3 2  3. Worrying Too Much About Different Things 2 3  4. Trouble Relaxing 0 2  5. Being So Restless it's Hard To Sit Still 0 2  6. Becoming Easily Annoyed or Irritable 1 3  7. Feeling Afraid As If Something Awful Might Happen 0 3  Total GAD-7 Score 8 16  Difficulty At Work, Home, or Getting  Along With Others? Not difficult at all Very difficult    PHQ-9 Scores    05/21/2022    3:15 PM 03/12/2022    2:20 PM 05/14/2017    9:12 AM  PHQ9 SCORE ONLY  PHQ-9 Total Score 6 6 1     ---------------------------------------------------------------------------------------------------      05/21/2022    3:15 PM 03/12/2022    2:20 PM 05/14/2017    9:12 AM  Depression screen PHQ 2/9  Decreased Interest 0 0 0  Down, Depressed, Hopeless 0 0 1  PHQ - 2 Score 0 0 1  Altered sleeping 0 0   Tired, decreased energy 2 2   Change in appetite 1 1   Feeling bad or failure about yourself  0 0   Trouble concentrating 3 3   Moving slowly or fidgety/restless 0 0   Suicidal thoughts 0 0   PHQ-9 Score 6 6   Difficult doing work/chores Not difficult at  all Somewhat difficult       05/21/2022    3:16 PM 03/12/2022    2:21 PM  GAD 7 : Generalized Anxiety Score  Nervous, Anxious, on Edge 2 1  Control/stop worrying 3 2  Worry too much - different things 2 3  Trouble relaxing 0 2  Restless 0 2  Easily annoyed or irritable 1 3  Afraid - awful might happen 0 3  Total GAD 7 Score 8 16  Anxiety Difficulty Not difficult at all Very difficult    Medications: Outpatient Medications Prior to Visit  Medication Sig  . albuterol (VENTOLIN HFA) 108 (90 Base) MCG/ACT inhaler Inhale 2 puffs into the lungs every 6 (six) hours as needed for wheezing or shortness of breath.  . busPIRone (BUSPAR) 7.5 MG tablet Take 1 tablet (7.5 mg total) by mouth 2 (two) times daily.  Marland Kitchen omeprazole (PRILOSEC) 20 MG capsule Take 1 capsule (20 mg total) by mouth daily.  . fluconazole (DIFLUCAN) 150 MG tablet Take 1 tab po q72h for yeast infection (Patient not taking: Reported on 03/12/2022)  . metroNIDAZOLE (FLAGYL) 500 MG tablet Take 1 tablet (500 mg total) by mouth 2 (two) times daily. (Patient  not taking: Reported on 03/12/2022)  . metroNIDAZOLE (METROGEL) 0.75 % vaginal gel One applicator qhs x 5 nights (Patient not taking: Reported on 03/12/2022)  . Norethindrone-Ethinyl Estradiol-Fe Biphas (LO LOESTRIN FE) 1 MG-10 MCG / 10 MCG tablet Take 1 tablet by mouth daily. Initiation, begin on day 1 of menstrual cycle; if started on a different day, use a non-hormonal contraceptive as back-up during first 7 days. May begin no earlier than 4 weeks postpartum if not breastfeeding or 4 weeks after a second trimester abortion. May begin immediately after first-trimester abortion or miscarriage (Patient not taking: Reported on 05/21/2022)   No facility-administered medications prior to visit.    Review of Systems Except see HPI      Objective    BP 120/65 (BP Location: Right Arm, Patient Position: Sitting, Cuff Size: Normal)   Pulse (!) 105   Temp 98.4 F (36.9 C) (Oral)   Wt 201  lb (91.2 kg)   SpO2 99%   BMI 30.79 kg/m  {Show previous vital signs (optional):23777}  Physical Exam   No results found for any visits on 05/21/22.  Assessment & Plan    1. Anxiety *** - busPIRone (BUSPAR) 7.5 MG tablet; Take 1 tablet (7.5 mg total) by mouth 2 (two) times daily.  Dispense: 60 tablet; Refill: 3  2. Gastroesophageal reflux disease without esophagitis ***  3. Intermittent asthma without complication, unspecified asthma severity How often: 2-3 times, twice a day, overdone Vaping 5000-9000 1-1.5 weeks  In May 18th No follow-ups on file.     The patient was advised to call back or seek an in-person evaluation if the symptoms worsen or if the condition fails to improve as anticipated.  I discussed the assessment and treatment plan with the patient. The patient was provided an opportunity to ask questions and all were answered. The patient agreed with the plan and demonstrated an understanding of the instructions.  I, Debera Lat, PA-C have reviewed all documentation for this visit. The documentation on  @CurDate @  for the exam, diagnosis, procedures, and orders are all accurate and complete.  Debera Lat, [facility], MMS [facility] [phone] (phone) [phone] (fax)  [facility]

## 2022-05-22 LAB — CBC WITH DIFFERENTIAL/PLATELET
Basophils Absolute: 0.1 10*3/uL (ref 0.0–0.2)
Basos: 1 %
EOS (ABSOLUTE): 0.2 10*3/uL (ref 0.0–0.4)
Eos: 2 %
Hematocrit: 34.6 % (ref 34.0–46.6)
Hemoglobin: 10.7 g/dL — ABNORMAL LOW (ref 11.1–15.9)
Immature Grans (Abs): 0 10*3/uL (ref 0.0–0.1)
Immature Granulocytes: 0 %
Lymphocytes Absolute: 2.1 10*3/uL (ref 0.7–3.1)
Lymphs: 33 %
MCH: 24.5 pg — ABNORMAL LOW (ref 26.6–33.0)
MCHC: 30.9 g/dL — ABNORMAL LOW (ref 31.5–35.7)
MCV: 79 fL (ref 79–97)
Monocytes Absolute: 0.5 10*3/uL (ref 0.1–0.9)
Monocytes: 7 %
Neutrophils Absolute: 3.7 10*3/uL (ref 1.4–7.0)
Neutrophils: 57 %
Platelets: 360 10*3/uL (ref 150–450)
RBC: 4.37 x10E6/uL (ref 3.77–5.28)
RDW: 14.5 % (ref 11.7–15.4)
WBC: 6.6 10*3/uL (ref 3.4–10.8)

## 2022-05-22 LAB — LIPID PANEL
Chol/HDL Ratio: 4.4 ratio (ref 0.0–4.4)
Cholesterol, Total: 172 mg/dL (ref 100–199)
HDL: 39 mg/dL — ABNORMAL LOW (ref >39)
LDL Chol Calc (NIH): 97 mg/dL (ref 0–99)
Triglycerides: 212 mg/dL — ABNORMAL HIGH (ref 0–149)
VLDL Cholesterol Cal: 36 mg/dL (ref 5–40)

## 2022-05-22 LAB — COMPREHENSIVE METABOLIC PANEL
ALT: 16 IU/L (ref 0–32)
AST: 15 IU/L (ref 0–40)
Albumin/Globulin Ratio: 1.7 (ref 1.2–2.2)
Albumin: 4.6 g/dL (ref 4.0–5.0)
Alkaline Phosphatase: 89 IU/L (ref 44–121)
BUN/Creatinine Ratio: 19 (ref 9–23)
BUN: 15 mg/dL (ref 6–20)
Bilirubin Total: 0.3 mg/dL (ref 0.0–1.2)
CO2: 23 mmol/L (ref 20–29)
Calcium: 9.9 mg/dL (ref 8.7–10.2)
Chloride: 104 mmol/L (ref 96–106)
Creatinine, Ser: 0.78 mg/dL (ref 0.57–1.00)
Globulin, Total: 2.7 g/dL (ref 1.5–4.5)
Glucose: 90 mg/dL (ref 70–99)
Potassium: 4.6 mmol/L (ref 3.5–5.2)
Sodium: 141 mmol/L (ref 134–144)
Total Protein: 7.3 g/dL (ref 6.0–8.5)
eGFR: 109 mL/min/{1.73_m2} (ref >59)

## 2022-05-22 LAB — HEMOGLOBIN A1C
Est. average glucose Bld gHb Est-mCnc: 111 mg/dL
Hgb A1c MFr Bld: 5.5 % (ref 4.8–5.6)

## 2022-05-22 LAB — TSH: TSH: 0.71 u[IU]/mL (ref 0.450–4.500)

## 2022-05-23 ENCOUNTER — Encounter: Payer: Self-pay | Admitting: Physician Assistant

## 2022-05-23 DIAGNOSIS — E669 Obesity, unspecified: Secondary | ICD-10-CM | POA: Insufficient documentation

## 2022-05-23 DIAGNOSIS — J4520 Mild intermittent asthma, uncomplicated: Secondary | ICD-10-CM | POA: Insufficient documentation

## 2022-05-23 NOTE — Assessment & Plan Note (Signed)
Chronic, worsening  Her anxiolytic medication was refilled - busPIRone (BUSPAR) 7.5 MG tablet; Take 1 tablet (7.5 mg total) by mouth 2 (two) times daily.  Dispense: 60 tablet; Refill: 3 Relaxation technique were advised

## 2022-05-23 NOTE — Assessment & Plan Note (Addendum)
Chronic, unstable Has been using albuterol inhaler more than 3 times a week, more than 2 times daily Needs to be reevaluated for asthma Continue albuterol inhaler Wait on maintenance inhaler When agrees, will order symbicort, advair for maintenance. Referral to allergy was placed She averages 5000-9000 puffs per 1-1.5 week Vaping cessation advised.

## 2022-05-23 NOTE — Progress Notes (Signed)
Hello Cindy Matthews ,   Your labwork results all are within normal limits. Except your hemoglobin is decreased, we will check your iron panel for iron-def anemia. Please, include in your diet - iron rich food Your lipids are elevated, please, start low cholesterol diet and regular exercise for weight loss/control.    Any questions please reach out to the office or message me on MyChart!  Best,  Debera Lat, PA-C

## 2022-05-23 NOTE — Assessment & Plan Note (Signed)
Chronic and BMI 30.79 today Pt needs to proceed with strict low-carb, low-fat diet and daily exercise. Weight loss of 5% of pt's current weight via healthy diet and daily exercise encouraged.  Labs ordered Will reassess

## 2022-05-25 ENCOUNTER — Other Ambulatory Visit: Payer: Self-pay | Admitting: Physician Assistant

## 2022-05-25 DIAGNOSIS — D508 Other iron deficiency anemias: Secondary | ICD-10-CM

## 2022-05-25 MED ORDER — IRON (FERROUS SULFATE) 325 (65 FE) MG PO TABS: 325.0000 mg | ORAL_TABLET | Freq: Every day | ORAL | 1 refills | Status: DC

## 2022-05-28 ENCOUNTER — Encounter: Payer: Medicaid Other | Admitting: Obstetrics and Gynecology

## 2022-06-15 NOTE — Progress Notes (Signed)
Please, let pt know that she her iron is low and she needs to start taking iron supplements OTC on the top of eating iron-rich meals and if she agrees I will place a referral to hematology. Encouraged to see ObGyn for an assessment as well. A referral for ObGYn was placed in February.   [facility] [phone]

## 2022-06-19 ENCOUNTER — Ambulatory Visit (INDEPENDENT_AMBULATORY_CARE_PROVIDER_SITE_OTHER): Payer: Medicaid Other | Admitting: Physician Assistant

## 2022-06-19 ENCOUNTER — Encounter: Payer: Self-pay | Admitting: Physician Assistant

## 2022-06-19 VITALS — BP 116/76 | HR 94 | Temp 98.2°F | Resp 16 | Ht 67.5 in | Wt 204.7 lb

## 2022-06-19 DIAGNOSIS — R350 Frequency of micturition: Secondary | ICD-10-CM

## 2022-06-19 DIAGNOSIS — N898 Other specified noninflammatory disorders of vagina: Secondary | ICD-10-CM

## 2022-06-19 LAB — POCT URINALYSIS DIPSTICK
Bilirubin, UA: NEGATIVE
Blood, UA: NEGATIVE
Glucose, UA: NEGATIVE
Ketones, UA: NEGATIVE
Leukocytes, UA: NEGATIVE
Nitrite, UA: NEGATIVE
Protein, UA: NEGATIVE
Spec Grav, UA: 1.015 (ref 1.010–1.025)
Urobilinogen, UA: 0.2 E.U./dL
pH, UA: 6.5 (ref 5.0–8.0)

## 2022-06-19 NOTE — Progress Notes (Signed)
I,Vanessa  Vital,acting as a Neurosurgeon for [employer], PA-C.,have documented all relevant documentation on the behalf of Alfredia Ferguson, PA-C,as directed by  Alfredia Ferguson, PA-C while in the presence of Alfredia Ferguson, PA-C.    Established patient visit   [name]   DOB: 1995/04/15   27 y.o. Female  MRN: [id number] Visit Date: 06/19/2022  Today's healthcare provider: Alfredia Ferguson, PA-C   Cc. Vaginal smell  Subjective    HPI  Pt reports an unusual smell to her vagina, reports prone to BV, but she stopped using certain soaps on the area.   She reports using a scented soap in that area which she thinks caused the change in smell. Denies itching, pain. Reports urinary frequency.   Patient's last menstrual period was 06/12/2022 (exact date).    Medications: Outpatient Medications Prior to Visit  Medication Sig   albuterol (VENTOLIN HFA) 108 (90 Base) MCG/ACT inhaler Inhale 2 puffs into the lungs every 6 (six) hours as needed for wheezing or shortness of breath.   busPIRone (BUSPAR) 7.5 MG tablet Take 1 tablet (7.5 mg total) by mouth 2 (two) times daily.   Iron, Ferrous Sulfate, 325 (65 Fe) MG TABS Take 325 mg by mouth daily. 325 mg ferrous sulfate (65 mg elemental iron) orally once daily, after a meal with orange juice   omeprazole (PRILOSEC) 20 MG capsule Take 1 capsule (20 mg total) by mouth daily.   Norethindrone-Ethinyl Estradiol-Fe Biphas (LO LOESTRIN FE) 1 MG-10 MCG / 10 MCG tablet Take 1 tablet by mouth daily. Initiation, begin on day 1 of menstrual cycle; if started on a different day, use a non-hormonal contraceptive as back-up during first 7 days. May begin no earlier than 4 weeks postpartum if not breastfeeding or 4 weeks after a second trimester abortion. May begin immediately after first-trimester abortion or miscarriage (Patient not taking: Reported on 05/21/2022)   [DISCONTINUED] fluconazole (DIFLUCAN) 150 MG tablet Take 1 tab po q72h for yeast infection  (Patient not taking: Reported on 03/12/2022)   [DISCONTINUED] metroNIDAZOLE (FLAGYL) 500 MG tablet Take 1 tablet (500 mg total) by mouth 2 (two) times daily. (Patient not taking: Reported on 03/12/2022)   [DISCONTINUED] metroNIDAZOLE (METROGEL) 0.75 % vaginal gel One applicator qhs x 5 nights (Patient not taking: Reported on 06/19/2022)   No facility-administered medications prior to visit.    Review of Systems  Constitutional:  Negative for fatigue and fever.  Respiratory:  Negative for cough and shortness of breath.   Cardiovascular:  Negative for chest pain and leg swelling.  Gastrointestinal:  Negative for abdominal pain.  Genitourinary:  Positive for frequency.  Neurological:  Negative for dizziness and headaches.      Objective    BP 116/76 (BP Location: Left Arm, Patient Position: Sitting, Cuff Size: Normal)   Pulse 94   Temp 98.2 F (36.8 C) (Oral)   Resp 16   Ht 5' 7.5" (1.715 m)   Wt 204 lb 11.2 oz (92.9 kg)   LMP 06/12/2022 (Exact Date)   SpO2 100%   BMI 31.59 kg/m   Physical Exam Vitals reviewed.  Constitutional:      Appearance: She is not ill-appearing.  HENT:     Head: Normocephalic.  Eyes:     Conjunctiva/sclera: Conjunctivae normal.  Cardiovascular:     Rate and Rhythm: Normal rate.  Pulmonary:     Effort: Pulmonary effort is normal. No respiratory distress.  Neurological:     General: No focal deficit present.  Mental Status: She is alert and oriented to person, place, and time.  Psychiatric:        Mood and Affect: Mood normal.        Behavior: Behavior normal.      Results for orders placed or performed in visit on 06/19/22  POCT Urinalysis Dipstick  Result Value Ref Range   Color, UA yellow    Clarity, UA clear    Glucose, UA Negative Negative   Bilirubin, UA Negative    Ketones, UA Negative    Spec Grav, UA 1.015 1.010 - 1.025   Blood, UA Negative    pH, UA 6.5 5.0 - 8.0   Protein, UA Negative Negative   Urobilinogen, UA 0.2 0.2 or  1.0 E.U./dL   Nitrite, UA Negative    Leukocytes, UA Negative Negative   Appearance Clear    Odor      Assessment & Plan     1. Urinary frequency UA negative. - POCT Urinalysis Dipstick  2. Vaginal odor Self swab to r/o bv/yeast/sti - NuSwab Vaginitis Plus (VG+)   Return if symptoms worsen or fail to improve.     I, Alfredia Ferguson, PA-C have reviewed all documentation for this visit. The documentation on  06/19/22   for the exam, diagnosis, procedures, and orders are all accurate and complete.  Alfredia Ferguson, PA-C [facility] [address] [place], [place], [address] Office: [phone] Fax: [phone]   [facility]

## 2022-06-22 ENCOUNTER — Other Ambulatory Visit: Payer: Self-pay | Admitting: Physician Assistant

## 2022-06-22 ENCOUNTER — Telehealth: Payer: Self-pay

## 2022-06-22 DIAGNOSIS — B9689 Other specified bacterial agents as the cause of diseases classified elsewhere: Secondary | ICD-10-CM

## 2022-06-22 LAB — NUSWAB VAGINITIS PLUS (VG+)
Atopobium vaginae: HIGH Score — AB
Candida albicans, NAA: NEGATIVE
Candida glabrata, NAA: NEGATIVE
Chlamydia trachomatis, NAA: NEGATIVE
Megasphaera 1: HIGH Score — AB
Neisseria gonorrhoeae, NAA: NEGATIVE
Trich vag by NAA: NEGATIVE

## 2022-06-22 MED ORDER — METRONIDAZOLE 500 MG PO TABS: 500.0000 mg | ORAL_TABLET | Freq: Two times a day (BID) | ORAL | 0 refills | Status: AC

## 2022-06-22 NOTE — Telephone Encounter (Signed)
Pt given lab results per notes of Janna, PA on 06/22/22. Pt verbalized understanding. Has appt with OBGYN on 07/23/22, had previous appts but was on cycle and another she had time incorrect for appt. Pt is ok with referral to hematology as well. Advised her also that results from 06/19/22 OV was back but [place], [place] hasn't made notes yet and will FU with her. Pt verbalized understanding.

## 2022-06-25 ENCOUNTER — Telehealth: Payer: Self-pay

## 2022-06-25 DIAGNOSIS — D508 Other iron deficiency anemias: Secondary | ICD-10-CM

## 2022-06-25 NOTE — Telephone Encounter (Signed)
Referral for hematology pt agreed Ross Ludwig, RN      06/22/22  9:41 AM Note Pt given lab results per notes of Janna, PA on 06/22/22. Pt verbalized understanding. Has appt with OBGYN on 07/23/22, had previous appts but was on cycle and another she had time incorrect for appt. Pt is ok with referral to hematology as well. Advised her also that results from 06/19/22 OV was back but [place], [place] hasn't made notes yet and will FU with her. Pt verbalized understanding.

## 2022-06-25 NOTE — Telephone Encounter (Signed)
-----   Message from [place], [place] sent at 06/15/2022  2:05 PM EDT ----- Please, let pt know that she her iron is low and she needs to start taking iron supplements OTC on the top of eating iron-rich meals and if she agrees I will place a referral to hematology. Encouraged to see ObGyn for an assessment as well. A referral for ObGYn was placed in February.    OBGYN at [facility] [phone]

## 2022-07-02 ENCOUNTER — Inpatient Hospital Stay: Payer: Medicaid Other

## 2022-07-02 ENCOUNTER — Inpatient Hospital Stay: Payer: Medicaid Other | Admitting: Internal Medicine

## 2022-07-09 ENCOUNTER — Inpatient Hospital Stay: Payer: Medicaid Other

## 2022-07-09 ENCOUNTER — Inpatient Hospital Stay: Payer: Medicaid Other | Admitting: Internal Medicine

## 2022-07-11 ENCOUNTER — Ambulatory Visit
Admission: EM | Admit: 2022-07-11 | Discharge: 2022-07-11 | Disposition: A | Discharge status: Home / Self Care | Payer: Medicaid Other | Attending: Family Medicine | Admitting: Family Medicine

## 2022-07-11 ENCOUNTER — Encounter: Payer: Self-pay | Admitting: Emergency Medicine

## 2022-07-11 DIAGNOSIS — N898 Other specified noninflammatory disorders of vagina: Secondary | ICD-10-CM | POA: Diagnosis not present

## 2022-07-11 LAB — WET PREP, GENITAL
Clue Cells Wet Prep HPF POC: NONE SEEN
Sperm: NONE SEEN
Trich, Wet Prep: NONE SEEN
WBC, Wet Prep HPF POC: <10 (ref <10)
Yeast Wet Prep HPF POC: NONE SEEN

## 2022-07-11 NOTE — ED Provider Notes (Signed)
[facility]    CSN: [id number] Arrival date & time: 07/11/22  1254      History   Chief Complaint Chief Complaint  Patient presents with   Vaginal Itching     HPI HPI TRACIA LACOMB is a 27 y.o. female.    Wynelle Beckmann presents for vaginal itching and irritation with vaginal discharge.  Tried nothing prior to arrival.  Has had any antibiotics in last 30 days for BV.   Denies known STI exposure.  Kila does not use condoms regularly. She is  not currently pregnant.  Patient's last menstrual period was 06/12/2022 (exact date). She is due for her period in a couple of days. Denies urinary symptoms.        Past Medical History:  Diagnosis Date   ADD (attention deficit disorder)    No meds since 2016   Anxiety    Asthma    BMI 28.0-28.9,adult    Eczema    GERD (gastroesophageal reflux disease)    Motion sickness    [address]    Patient Active Problem List   Diagnosis Date Noted   Obesity (BMI 30.0-34.9) 05/23/2022   Intermittent asthma without complication 05/23/2022   Anxiety 01/30/2016   History of recurrent UTIs 11/13/2014   ADD (attention deficit disorder) 05/25/2014   Dermatitis, eczematoid 05/25/2014   Acid reflux 05/25/2014   H/O herpes labialis 05/25/2014    Past Surgical History:  Procedure Laterality Date   NO PAST SURGERIES     NO PAST SURGERIES     TONSILLECTOMY     TONSILLECTOMY AND ADENOIDECTOMY N/A 03/21/2016   Procedure: TONSILLECTOMY AND ADENOIDECTOMY;  Surgeon: Bud Face, MD;  Location: [facility];  Service: ENT;  Laterality: N/A;  Latex sensitivity  adenoids cauterized no tissue sent    OB History     Gravida  1   Para      Term      Preterm      AB  1   Living         SAB  1   IAB      Ectopic      Multiple      Live Births               Home Medications    Prior to Admission medications   Medication Sig Start Date End Date Taking? Authorizing Provider  albuterol  (VENTOLIN HFA) 108 (90 Base) MCG/ACT inhaler Inhale 2 puffs into the lungs every 6 (six) hours as needed for wheezing or shortness of breath. 03/12/22   Ostwalt, Edmon Crape, PA-C  busPIRone (BUSPAR) 7.5 MG tablet Take 1 tablet (7.5 mg total) by mouth 2 (two) times daily. 05/21/22   Ostwalt, Edmon Crape, PA-C  Iron, Ferrous Sulfate, 325 (65 Fe) MG TABS Take 325 mg by mouth daily. 325 mg ferrous sulfate (65 mg elemental iron) orally once daily, after a meal with orange juice 05/25/22   Ostwalt, Janna, PA-C  Norethindrone-Ethinyl Estradiol-Fe Biphas (LO LOESTRIN FE) 1 MG-10 MCG / 10 MCG tablet Take 1 tablet by mouth daily. Initiation, begin on day 1 of menstrual cycle; if started on a different day, use a non-hormonal contraceptive as back-up during first 7 days. May begin no earlier than 4 weeks postpartum if not breastfeeding or 4 weeks after a second trimester abortion. May begin immediately after first-trimester abortion or miscarriage Patient not taking: Reported on 05/21/2022 03/12/22   Debera Lat, PA-C  omeprazole (PRILOSEC) 20 MG capsule Take  1 capsule (20 mg total) by mouth daily. 03/12/22   Debera Lat, PA-C    Family History Family History  Problem Relation Age of Onset   Asthma Sister    Anxiety disorder Sister    Hypertension Maternal Grandmother    Diabetes Maternal Grandfather    Thyroid disease Paternal Grandmother     Social History Social History   Tobacco Use   Smoking status: Every Day    Packs/day: .5    Types: E-cigarettes, Cigarettes    Last attempt to quit: 01/15/2018    Years since quitting: 4.4   Smokeless tobacco: Former   Tobacco comments:    Quit 4-5 months ago   Vaping Use   Vaping Use: Every day   Start date: 01/16/2019  Substance Use Topics   Alcohol use: No    Alcohol/week: 0.0 standard drinks of alcohol   Drug use: Not Currently    Types: Marijuana     Allergies   Cat hair extract, Dog fennel allergy skin test, Dust mite extract, Nitrofurantoin, Sulfa  antibiotics, and Latex   Review of Systems Review of Systems: :negative unless otherwise stated in HPI.      Physical Exam Triage Vital Signs ED Triage Vitals  Enc Vitals Group     BP 07/11/22 1416 123/69     Pulse Rate 07/11/22 1416 97     Resp 07/11/22 1416 18     Temp 07/11/22 1416 98.3 F (36.8 C)     Temp Source 07/11/22 1416 Oral     SpO2 07/11/22 1416 98 %     Weight --      Height --      Head Circumference --      Peak Flow --      Pain Score 07/11/22 1413 0     Pain Loc --      Pain Edu? --      Excl. in GC? --    No data found.  Updated Vital Signs BP 123/69 (BP Location: Left Arm)   Pulse 97   Temp 98.3 F (36.8 C) (Oral)   Resp 18   LMP 06/12/2022 (Exact Date)   SpO2 98%   Visual Acuity Right Eye Distance:   Left Eye Distance:   Bilateral Distance:    Right Eye Near:   Left Eye Near:    Bilateral Near:     Physical Exam GEN: well appearing female in no acute distress  CVS: well perfused  RESP: speaking in full sentences without pause  GU: deferred, patient performed self swab      UC Treatments / Results  Labs (all labs ordered are listed, but only abnormal results are displayed) Labs Reviewed  WET PREP, GENITAL  CERVICOVAGINAL ANCILLARY ONLY    EKG   Radiology No results found.  Procedures Procedures (including critical care time)  Medications Ordered in UC Medications - No data to display  Initial Impression / Assessment and Plan / UC Course  I have reviewed the triage vital signs and the nursing notes.  Pertinent labs & imaging results that were available during my care of the patient were reviewed by me and considered in my medical decision making (see chart for details).      Patient is a 27 y.o.Marland Kitchen female  who presents for vaginal discharge and irritation.  Overall patient is well-appearing and afebrile.  Vital signs stable. Wet prep not concerning for bacterial or yeast infection. Gonorrhea and Chlamydia testing  obtained.  Return precautions including  abdominal pain, fever, chills, nausea, or vomiting given. Discussed MDM, treatment plan and plan for follow-up with patient who agrees with plan.       Final Clinical Impressions(s) / UC Diagnoses   Final diagnoses:  Vaginal irritation  Vaginal discharge     Discharge Instructions      Your vaginal swab did not show evidence of yeast or BV.   Your STD test results will be available in the next 72 hours. If positive, someone will contact you.  You should see your results in your MyChart account.   If everything returns normal, you likely have physiologic vaginal discharge related to your upcoming period.      ED Prescriptions   None    PDMP not reviewed this encounter.   Katha Cabal, DO 07/11/22 1651

## 2022-07-11 NOTE — Discharge Instructions (Addendum)
Your vaginal swab did not show evidence of yeast or BV.   Your STD test results will be available in the next 72 hours. If positive, someone will contact you.  You should see your results in your MyChart account.   If everything returns normal, you likely have physiologic vaginal discharge related to your upcoming period.

## 2022-07-11 NOTE — ED Triage Notes (Signed)
Pt presents with vaginal itching and irritation since yesterday. Pt states she just finished antibiotics.

## 2022-07-12 LAB — CERVICOVAGINAL ANCILLARY ONLY
Chlamydia: NEGATIVE
Comment: NEGATIVE
Comment: NORMAL
Neisseria Gonorrhea: NEGATIVE

## 2022-07-16 ENCOUNTER — Inpatient Hospital Stay: Payer: Medicaid Other

## 2022-07-16 ENCOUNTER — Inpatient Hospital Stay: Payer: Medicaid Other | Admitting: Internal Medicine

## 2022-07-22 NOTE — Progress Notes (Deleted)
Cindy Lat, PA-C   No chief complaint on file.   HPI:      Cindy Matthews is a 27 y.o. G1P0010 whose LMP was No LMP recorded., presents today for NP ref for pap smear, referred by PCP  Hx of IDA on 5/24 labs  Patient Active Problem List   Diagnosis Date Noted   Obesity (BMI 30.0-34.9) 05/23/2022   Intermittent asthma without complication 05/23/2022   Anxiety 01/30/2016   History of recurrent UTIs 11/13/2014   ADD (attention deficit disorder) 05/25/2014   Dermatitis, eczematoid 05/25/2014   Acid reflux 05/25/2014   H/O herpes labialis 05/25/2014    Past Surgical History:  Procedure Laterality Date   NO PAST SURGERIES     NO PAST SURGERIES     TONSILLECTOMY     TONSILLECTOMY AND ADENOIDECTOMY N/A 03/21/2016   Procedure: TONSILLECTOMY AND ADENOIDECTOMY;  Surgeon: Bud Face, MD;  Location: [facility];  Service: ENT;  Laterality: N/A;  Latex sensitivity  adenoids cauterized no tissue sent    Family History  Problem Relation Age of Onset   Asthma Sister    Anxiety disorder Sister    Hypertension Maternal Grandmother    Diabetes Maternal Grandfather    Thyroid disease Paternal Grandmother     Social History   Socioeconomic History   Marital status: Single    Spouse name: Not on file   Number of children: Not on file   Years of education: Not on file   Highest education level: Not on file  Occupational History   Not on file  Tobacco Use   Smoking status: Every Day    Packs/day: .5    Types: E-cigarettes, Cigarettes    Last attempt to quit: 01/15/2018    Years since quitting: 4.5   Smokeless tobacco: Former   Tobacco comments:    Quit 4-5 months ago   Vaping Use   Vaping Use: Every day   Start date: 01/16/2019  Substance and Sexual Activity   Alcohol use: No    Alcohol/week: 0.0 standard drinks of alcohol   Drug use: Not Currently    Types: Marijuana   Sexual activity: Yes    Birth control/protection: None  Other Topics Concern    Not on file  Social History Narrative   Not on file   Social Determinants of Health   Financial Resource Strain: Not on file  Food Insecurity: Not on file  Transportation Needs: No Transportation Needs (03/19/2022)   PRAPARE - Transportation    Lack of Transportation (Medical): No    Lack of Transportation (Non-Medical): No  Physical Activity: Not on file  Stress: Not on file  Social Connections: Not on file  Intimate Partner Violence: Not on file    Outpatient Medications Prior to Visit  Medication Sig Dispense Refill   albuterol (VENTOLIN HFA) 108 (90 Base) MCG/ACT inhaler Inhale 2 puffs into the lungs every 6 (six) hours as needed for wheezing or shortness of breath. 8 g 2   busPIRone (BUSPAR) 7.5 MG tablet Take 1 tablet (7.5 mg total) by mouth 2 (two) times daily. 60 tablet 3   Iron, Ferrous Sulfate, 325 (65 Fe) MG TABS Take 325 mg by mouth daily. 325 mg ferrous sulfate (65 mg elemental iron) orally once daily, after a meal with orange juice 30 tablet 1   Norethindrone-Ethinyl Estradiol-Fe Biphas (LO LOESTRIN FE) 1 MG-10 MCG / 10 MCG tablet Take 1 tablet by mouth daily. Initiation, begin on day 1 of menstrual  cycle; if started on a different day, use a non-hormonal contraceptive as back-up during first 7 days. May begin no earlier than 4 weeks postpartum if not breastfeeding or 4 weeks after a second trimester abortion. May begin immediately after first-trimester abortion or miscarriage (Patient not taking: Reported on 05/21/2022) 28 tablet 11   omeprazole (PRILOSEC) 20 MG capsule Take 1 capsule (20 mg total) by mouth daily. 30 capsule 3   No facility-administered medications prior to visit.      ROS:  Review of Systems BREAST: No symptoms   OBJECTIVE:   Vitals:  There were no vitals taken for this visit.  Physical Exam  Results: No results found for this or any previous visit (from the past 24 hour(s)).   Assessment/Plan: No diagnosis found.    No orders of the  defined types were placed in this encounter.     No follow-ups on file.  Demonie Kassa B. Bryston Colocho, PA-C 07/22/2022 4:49 PM

## 2022-07-23 ENCOUNTER — Encounter: Payer: Medicaid Other | Admitting: Obstetrics and Gynecology

## 2022-07-25 ENCOUNTER — Encounter: Payer: Self-pay | Admitting: Obstetrics and Gynecology

## 2022-08-06 LAB — FERRITIN: Ferritin: 8 ng/mL — ABNORMAL LOW (ref 15–150)

## 2022-08-06 LAB — SPECIMEN STATUS REPORT

## 2022-08-06 LAB — IRON AND TIBC
Iron Saturation: 5 % — CL (ref 15–55)
Iron: 22 ug/dL — ABNORMAL LOW (ref 27–159)
Total Iron Binding Capacity: 458 ug/dL — ABNORMAL HIGH (ref 250–450)
UIBC: 436 ug/dL — ABNORMAL HIGH (ref 131–425)

## 2022-08-13 ENCOUNTER — Ambulatory Visit
Admission: RE | Admit: 2022-08-13 | Discharge: 2022-08-13 | Disposition: A | Discharge status: Home / Self Care | Payer: Medicaid Other | Source: Ambulatory Visit | Attending: Emergency Medicine | Admitting: Emergency Medicine

## 2022-08-13 VITALS — BP 124/79 | HR 87 | Temp 98.5°F | Resp 16 | Ht 67.5 in | Wt 204.0 lb

## 2022-08-13 DIAGNOSIS — B9689 Other specified bacterial agents as the cause of diseases classified elsewhere: Secondary | ICD-10-CM | POA: Diagnosis not present

## 2022-08-13 DIAGNOSIS — N760 Acute vaginitis: Secondary | ICD-10-CM

## 2022-08-13 LAB — WET PREP, GENITAL
Sperm: NONE SEEN
Trich, Wet Prep: NONE SEEN
WBC, Wet Prep HPF POC: <10 — AB (ref <10)
Yeast Wet Prep HPF POC: NONE SEEN

## 2022-08-13 LAB — URINALYSIS, W/ REFLEX TO CULTURE (INFECTION SUSPECTED)
Bilirubin Urine: NEGATIVE
Glucose, UA: NEGATIVE mg/dL
Ketones, ur: NEGATIVE mg/dL
Leukocytes,Ua: NEGATIVE
Nitrite: NEGATIVE
Protein, ur: NEGATIVE mg/dL
Specific Gravity, Urine: 1.02 (ref 1.005–1.030)
WBC, UA: NONE SEEN WBC/hpf (ref 0–5)
pH: 6 (ref 5.0–8.0)

## 2022-08-13 MED ORDER — METRONIDAZOLE 500 MG PO TABS: 500.0000 mg | ORAL_TABLET | Freq: Two times a day (BID) | ORAL | 0 refills | Status: DC

## 2022-08-13 MED ORDER — FLUCONAZOLE 150 MG PO TABS: 150.0000 mg | ORAL_TABLET | Freq: Every day | ORAL | 0 refills | Status: DC

## 2022-08-13 NOTE — ED Provider Notes (Signed)
[facility]    CSN: [id number] Arrival date & time: 08/13/22  1504      History   Chief Complaint Chief Complaint  Patient presents with   Vaginal Discomfort   Vaginal Odor    HPI Cindy Matthews is a 27 y.o. female who presents with abnormal vaginal odor and burning x 3 days. Denies blood in urine,  or concern of STD. Sometimes feels some dysuria, but has not had frequency. Denies abdominal pain.     Past Medical History:  Diagnosis Date   ADD (attention deficit disorder)    No meds since 2016   Anxiety    Asthma    BMI 28.0-28.9,adult    Eczema    GERD (gastroesophageal reflux disease)    Motion sickness    [address]    Patient Active Problem List   Diagnosis Date Noted   Obesity (BMI 30.0-34.9) 05/23/2022   Intermittent asthma without complication 05/23/2022   Anxiety 01/30/2016   History of recurrent UTIs 11/13/2014   ADD (attention deficit disorder) 05/25/2014   Dermatitis, eczematoid 05/25/2014   Acid reflux 05/25/2014   H/O herpes labialis 05/25/2014    Past Surgical History:  Procedure Laterality Date   NO PAST SURGERIES     NO PAST SURGERIES     TONSILLECTOMY     TONSILLECTOMY AND ADENOIDECTOMY N/A 03/21/2016   Procedure: TONSILLECTOMY AND ADENOIDECTOMY;  Surgeon: Bud Face, MD;  Location: [facility];  Service: ENT;  Laterality: N/A;  Latex sensitivity  adenoids cauterized no tissue sent    OB History     Gravida  1   Para      Term      Preterm      AB  1   Living         SAB  1   IAB      Ectopic      Multiple      Live Births               Home Medications    Prior to Admission medications   Medication Sig Start Date End Date Taking? Authorizing Provider  albuterol (VENTOLIN HFA) 108 (90 Base) MCG/ACT inhaler Inhale 2 puffs into the lungs every 6 (six) hours as needed for wheezing or shortness of breath. 03/12/22  Yes Ostwalt, Janna, PA-C  busPIRone (BUSPAR) 7.5 MG tablet Take 1  tablet (7.5 mg total) by mouth 2 (two) times daily. 05/21/22  Yes Ostwalt, Edmon Crape, PA-C  fluconazole (DIFLUCAN) 150 MG tablet Take 1 tablet (150 mg total) by mouth daily. 08/13/22  Yes Rodriguez-Southworth, Nettie Elm, PA-C  Iron, Ferrous Sulfate, 325 (65 Fe) MG TABS Take 325 mg by mouth daily. 325 mg ferrous sulfate (65 mg elemental iron) orally once daily, after a meal with orange juice 05/25/22  Yes Ostwalt, Janna, PA-C  metroNIDAZOLE (FLAGYL) 500 MG tablet Take 1 tablet (500 mg total) by mouth 2 (two) times daily. 08/13/22  Yes Rodriguez-Southworth, Nettie Elm, PA-C  omeprazole (PRILOSEC) 20 MG capsule Take 1 capsule (20 mg total) by mouth daily. 03/12/22  Yes Ostwalt, Edmon Crape, PA-C    Family History Family History  Problem Relation Age of Onset   Asthma Sister    Anxiety disorder Sister    Hypertension Maternal Grandmother    Diabetes Maternal Grandfather    Thyroid disease Paternal Grandmother     Social History Social History   Tobacco Use   Smoking status: Every Day    Current packs/day: 0.00  Types: E-cigarettes, Cigarettes    Last attempt to quit: 01/15/2018    Years since quitting: 4.5   Smokeless tobacco: Former   Tobacco comments:    Quit 4-5 months ago   Vaping Use   Vaping status: Every Day   Start date: 01/16/2019  Substance Use Topics   Alcohol use: No    Alcohol/week: 0.0 standard drinks of alcohol   Drug use: Not Currently    Types: Marijuana     Allergies   Cat hair extract, Dog fennel allergy skin test, Dust mite extract, Nitrofurantoin, Sulfa antibiotics, and Latex   Review of Systems Review of Systems  Genitourinary:  Negative for genital sores.    As noted in HPI  Physical Exam Triage Vital Signs ED Triage Vitals  Encounter Vitals Group     BP 08/13/22 1523 124/79     Systolic BP Percentile --      Diastolic BP Percentile --      Pulse Rate 08/13/22 1523 87     Resp 08/13/22 1523 16     Temp 08/13/22 1523 98.5 F (36.9 C)     Temp Source 08/13/22  1523 Oral     SpO2 08/13/22 1523 99 %     Weight 08/13/22 1522 204 lb (92.5 kg)     Height 08/13/22 1522 5' 7.5" (1.715 m)     Head Circumference --      Peak Flow --      Pain Score 08/13/22 1528 0     Pain Loc --      Pain Education --      Exclude from Growth Chart --    No data found.  Updated Vital Signs BP 124/79 (BP Location: Right Arm)   Pulse 87   Temp 98.5 F (36.9 C) (Oral)   Resp 16   Ht 5' 7.5" (1.715 m)   Wt 204 lb (92.5 kg)   SpO2 99%   BMI 31.48 kg/m   Visual Acuity Right Eye Distance:   Left Eye Distance:   Bilateral Distance:    Right Eye Near:   Left Eye Near:    Bilateral Near:     Physical Exam Vitals and nursing note reviewed.  Constitutional:      General: She is not in acute distress.    Appearance: She is normal weight. She is not toxic-appearing.  HENT:     Right Ear: External ear normal.     Left Ear: External ear normal.  Eyes:     General: No scleral icterus.    Conjunctiva/sclera: Conjunctivae normal.  Pulmonary:     Effort: Pulmonary effort is normal.  Musculoskeletal:        General: Normal range of motion.     Cervical back: Neck supple.  Skin:    General: Skin is warm and dry.  Neurological:     Mental Status: She is alert and oriented to person, place, and time.     Gait: Gait normal.  Psychiatric:        Mood and Affect: Mood normal.        Behavior: Behavior normal.        Thought Content: Thought content normal.        Judgment: Judgment normal.       UC Treatments / Results  Labs (all labs ordered are listed, but only abnormal results are displayed) Labs Reviewed  WET PREP, GENITAL - Abnormal; Notable for the following components:      Result Value  Clue Cells Wet Prep HPF POC PRESENT (*)    WBC, Wet Prep HPF POC <10 (*)    All other components within normal limits  URINALYSIS, W/ REFLEX TO CULTURE (INFECTION SUSPECTED) - Abnormal; Notable for the following components:   Hgb urine dipstick MODERATE (*)     Bacteria, UA RARE (*)    All other components within normal limits    EKG   Radiology No results found.  Procedures Procedures (including critical care time)  Medications Ordered in UC Medications - No data to display  Initial Impression / Assessment and Plan / UC Course  I have reviewed the triage vital signs and the nursing notes.  Pertinent labs  results that were available during my care of the patient were reviewed by me and considered in my medical decision making (see chart for details).  BV  I placed her on Flagyl and Diflucan as noted.    Final Clinical Impressions(s) / UC Diagnoses   Final diagnoses:  Bacterial vaginitis   Discharge Instructions   None    ED Prescriptions     Medication Sig Dispense Auth. Provider   metroNIDAZOLE (FLAGYL) 500 MG tablet Take 1 tablet (500 mg total) by mouth 2 (two) times daily. 14 tablet Rodriguez-Southworth, Gibson Telleria, PA-C   fluconazole (DIFLUCAN) 150 MG tablet Take 1 tablet (150 mg total) by mouth daily. 2 tablet Rodriguez-Southworth, Nettie Elm, PA-C      PDMP not reviewed this encounter.   Garey Ham, [place] 08/13/22 1608

## 2022-08-13 NOTE — ED Triage Notes (Signed)
Pt c/o vaginal odor & burning x3 days. Denies any hematuria or concerns for STD.

## 2022-08-16 ENCOUNTER — Telehealth: Payer: Self-pay

## 2022-08-16 MED ORDER — FLUCONAZOLE 150 MG PO TABS: 150.0000 mg | ORAL_TABLET | Freq: Every day | ORAL | 0 refills | Status: DC

## 2022-08-16 NOTE — Telephone Encounter (Signed)
Patient called to report that she had accidentally thrown her Diflucan away and she is requesting it be reordered.  Prescription sent to pharmacy.

## 2022-09-10 ENCOUNTER — Ambulatory Visit (INDEPENDENT_AMBULATORY_CARE_PROVIDER_SITE_OTHER): Payer: Medicaid Other | Admitting: Physician Assistant

## 2022-09-10 ENCOUNTER — Encounter: Payer: Self-pay | Admitting: Physician Assistant

## 2022-09-10 VITALS — BP 114/89 | HR 101 | Temp 97.7°F | Resp 12 | Ht 67.5 in | Wt 212.6 lb

## 2022-09-10 DIAGNOSIS — Z0000 Encounter for general adult medical examination without abnormal findings: Secondary | ICD-10-CM

## 2022-09-10 DIAGNOSIS — F17290 Nicotine dependence, other tobacco product, uncomplicated: Secondary | ICD-10-CM

## 2022-09-10 DIAGNOSIS — F419 Anxiety disorder, unspecified: Secondary | ICD-10-CM | POA: Diagnosis not present

## 2022-09-10 DIAGNOSIS — E669 Obesity, unspecified: Secondary | ICD-10-CM

## 2022-09-10 NOTE — Progress Notes (Unsigned)
Complete physical exam  [name]   DOB: 1995/04/25   27 y.o. Female  MRN: [id number] Visit Date: 09/10/2022  Today's healthcare provider: Debera Lat, PA-C   Chief Complaint  Patient presents with   Annual Exam    Patient reports feeling well. She declined Pap today. Reports she will go to GYN.    Subjective    Cindy Matthews is a 27 y.o. female who presents today for a complete physical exam.  She reports consuming a general diet. {Exercise:19826} She generally feels {well/fairly well/poorly:18703}. She reports sleeping {well/fairly well/poorly:18703}. She does have additional problems to discuss today.  HPI HPI     Annual Exam    Additional comments: Patient reports feeling well. She declined Pap today. Reports she will go to GYN.       Last edited by Myles Lipps, CMA on 09/10/2022  2:23 PM.      *** Discussed the use of AI scribe software for clinical note transcription with the patient, who gave verbal consent to proceed.  History of Present Illness            Last depression screening scores    09/10/2022    2:22 PM 06/19/2022    2:57 PM 05/21/2022    3:15 PM  PHQ 2/9 Scores  PHQ - 2 Score 4 0 0  PHQ- 9 Score 16 2 6    Last fall risk screening    09/10/2022    2:22 PM  Fall Risk   Falls in the past year? 0  Number falls in past yr: 0  Injury with Fall? 0  Risk for fall due to : No Fall Risks  Follow up Falls evaluation completed   Last Audit-C alcohol use screening    06/19/2022    2:58 PM  Alcohol Use Disorder Test (AUDIT)  1. How often do you have a drink containing alcohol? 1  2. How many drinks containing alcohol do you have on a typical day when you are drinking? 2  3. How often do you have six or more drinks on one occasion? 0  AUDIT-C Score 3   A score of 3 or more in women, and 4 or more in men indicates increased risk for alcohol abuse, EXCEPT if all of the points are from question 1   Past Medical History:   Diagnosis Date   ADD (attention deficit disorder)    No meds since 2016   Anxiety    Asthma    BMI 28.0-28.9,adult    Eczema    GERD (gastroesophageal reflux disease)    Motion sickness    [address]   Past Surgical History:  Procedure Laterality Date   NO PAST SURGERIES     NO PAST SURGERIES     TONSILLECTOMY     TONSILLECTOMY AND ADENOIDECTOMY N/A 03/21/2016   Procedure: TONSILLECTOMY AND ADENOIDECTOMY;  Surgeon: Bud Face, MD;  Location: [facility];  Service: ENT;  Laterality: N/A;  Latex sensitivity  adenoids cauterized no tissue sent   Social History   Socioeconomic History   Marital status: Single    Spouse name: Not on file   Number of children: Not on file   Years of education: Not on file   Highest education level: Not on file  Occupational History   Not on file  Tobacco Use   Smoking status: Every Day    Current packs/day: 0.00    Types: E-cigarettes, Cigarettes  Last attempt to quit: 01/15/2018    Years since quitting: 4.6   Smokeless tobacco: Former   Tobacco comments:    Quit 4-5 months ago   Vaping Use   Vaping status: Every Day   Start date: 01/16/2019  Substance and Sexual Activity   Alcohol use: No    Alcohol/week: 0.0 standard drinks of alcohol   Drug use: Not Currently    Types: Marijuana   Sexual activity: Yes    Birth control/protection: None  Other Topics Concern   Not on file  Social History Narrative   Not on file   Social Determinants of Health   Financial Resource Strain: Not on file  Food Insecurity: Not on file  Transportation Needs: No Transportation Needs (03/19/2022)   PRAPARE - Administrator, Civil Service (Medical): No    Lack of Transportation (Non-Medical): No  Physical Activity: Not on file  Stress: Not on file  Social Connections: Unknown (05/30/2021)   Received from [facility]   Social Network    Social Network: Not on file  Intimate Partner Violence: Unknown (04/21/2021)    Received from [facility]   HITS    Physically Hurt: Not on file    Insult or Talk Down To: Not on file    Threaten Physical Harm: Not on file    Scream or Curse: Not on file   Family Status  Relation Name Status   Mother  Alive   Father  Alive   Sister  Alive   MGM  Alive   MGF  Alive   PGM  Alive   PGF  Alive  No partnership data on file   Family History  Problem Relation Age of Onset   Asthma Sister    Anxiety disorder Sister    Hypertension Maternal Grandmother    Diabetes Maternal Grandfather    Thyroid disease Paternal Grandmother    Allergies  Allergen Reactions   Cat Hair Extract Itching    Other reaction(s): Sneezing Watery eyes   Dog Fennel Allergy Skin Test    Dust Mite Extract    Nitrofurantoin Nausea Only   Sulfa Antibiotics Other (See Comments)    Kidney pain and seasonal allergy symptoms   Latex Rash    Patient Care Team: Debera Lat, PA-C as PCP - General (Physician Assistant)   Medications: Outpatient Medications Prior to Visit  Medication Sig   albuterol (VENTOLIN HFA) 108 (90 Base) MCG/ACT inhaler Inhale 2 puffs into the lungs every 6 (six) hours as needed for wheezing or shortness of breath.   busPIRone (BUSPAR) 7.5 MG tablet Take 1 tablet (7.5 mg total) by mouth 2 (two) times daily.   omeprazole (PRILOSEC) 20 MG capsule Take 1 capsule (20 mg total) by mouth daily.   Iron, Ferrous Sulfate, 325 (65 Fe) MG TABS Take 325 mg by mouth daily. 325 mg ferrous sulfate (65 mg elemental iron) orally once daily, after a meal with orange juice (Patient not taking: Reported on 09/10/2022)   [DISCONTINUED] fluconazole (DIFLUCAN) 150 MG tablet Take 1 tablet (150 mg total) by mouth daily.   [DISCONTINUED] metroNIDAZOLE (FLAGYL) 500 MG tablet Take 1 tablet (500 mg total) by mouth 2 (two) times daily.   No facility-administered medications prior to visit.    Review of Systems Except see HPI  {Insert previous labs (optional):23779} {See past labs  Heme   Chem  Endocrine  Serology  Results Review (optional):1}  Objective    BP 114/89 (BP Location: Left Arm, Patient  Position: Sitting, Cuff Size: Large)   Pulse (!) 101   Temp 97.7 F (36.5 C) (Temporal)   Resp 12   Ht 5' 7.5" (1.715 m)   Wt 212 lb 9.6 oz (96.4 kg)   LMP 09/02/2022 (Exact Date)   SpO2 100%   BMI 32.81 kg/m  {Insert last BP/Wt (optional):23777}{See vitals history (optional):1}    Physical Exam   No results found for any visits on 09/10/22.  Assessment & Plan    Routine Health Maintenance and Physical Exam  Exercise Activities and Dietary recommendations  Goals   None     Immunization History  Administered Date(s) Administered   DTaP 12/07/1997, 02/07/1998, 04/05/1998, 12/05/1998, 11/06/2001   HIB (PRP-OMP) 12/07/1997, 02/07/1998, 12/05/1998   HPV Quadrivalent 03/18/2013   Hepatitis A 05/26/2007, 12/20/2008   Hepatitis A, Ped/Adol-2 Dose 05/26/2007, 12/20/2008   Hepatitis B 04/05/1998, 06/07/1998, 03/23/1999   Hepatitis B, PED/ADOLESCENT 04/05/1998, 06/07/1998, 03/23/1999   Hpv-Unspecified 11/02/2010, 07/27/2011   IPV 12/07/1997, 02/07/1998, 09/08/1998, 11/06/2001   Influenza-Unspecified 11/02/2010   MMR 09/08/1998, 11/06/2001   Meningococcal Conjugate 07/27/2011   Pneumococcal Conjugate PCV 7 12/05/1998, 03/23/1999   Tdap 12/20/2008   Varicella 09/08/1998, 05/26/2007    Health Maintenance  Topic Date Due   Hepatitis C Screening  Never done   Pap Smear  Never done   Pap Smear  Never done   DTaP/Tdap/Td vaccine (7 - Td or Tdap) 12/21/2018   COVID-19 Vaccine (1 - 2023-24 season) Never done   Flu Shot  04/15/2023*   HPV Vaccine  Completed   HIV Screening  Completed  *Topic was postponed. The date shown is not the original due date.    Discussed health benefits of physical activity, and encouraged her to engage in regular exercise appropriate for her age and condition.  Assessment and Plan              ***  Return in about 3 months  (around 12/11/2022) for chronic disease f/u.     [facility]

## 2022-10-04 ENCOUNTER — Ambulatory Visit
Admission: RE | Admit: 2022-10-04 | Discharge: 2022-10-04 | Disposition: A | Discharge status: Home / Self Care | Payer: Medicaid Other | Source: Ambulatory Visit

## 2022-10-04 VITALS — BP 108/68 | HR 95 | Temp 98.5°F | Resp 18

## 2022-10-04 DIAGNOSIS — H6501 Acute serous otitis media, right ear: Secondary | ICD-10-CM | POA: Diagnosis not present

## 2022-10-04 MED ORDER — BENZONATATE 100 MG PO CAPS: 100.0000 mg | ORAL_CAPSULE | Freq: Three times a day (TID) | ORAL | 0 refills | Status: DC

## 2022-10-04 MED ORDER — PROMETHAZINE-DM 6.25-15 MG/5ML PO SYRP: 5.0000 mL | ORAL_SOLUTION | Freq: Every evening | ORAL | 0 refills | Status: DC | PRN

## 2022-10-04 MED ORDER — AMOXICILLIN-POT CLAVULANATE 875-125 MG PO TABS: 1.0000 | ORAL_TABLET | Freq: Two times a day (BID) | ORAL | 0 refills | Status: DC

## 2022-10-04 MED ORDER — FLUCONAZOLE 150 MG PO TABS: 150.0000 mg | ORAL_TABLET | Freq: Every day | ORAL | 0 refills | Status: AC

## 2022-10-04 NOTE — ED Triage Notes (Signed)
Pt presents with a cough, congestion, bilateral ear pain and hoarse x 5 days.

## 2022-10-04 NOTE — ED Provider Notes (Signed)
[facility]    CSN: [id number] Arrival date & time: 10/04/22  1147      History   Chief Complaint Chief Complaint  Patient presents with   Cough    I have a cough stuffy nose, and my voice is hoarse. I have had a fever off and on since two days ago. I've been taking Tylenol for the fever and cold medicine for other symptoms. - Entered by patient   Nasal Congestion   Otalgia    HPI Cindy Matthews is a 27 y.o. female.   Patient presents for evaluation of fever, nasal congestion, rhinorrhea, bilateral ear pain, sore throat and a productive cough present for 5 days.  Began to experience hoarseness 4 days ago.  Fever peaking at 102.  Known sick contact.  Tolerating food and liquids.  Has attempted use of Robitussin and [facility] cold and flu mix which has been minimally helpful.  Denies respiratory history, vapes.  History of asthma, denies shortness of breath and wheezing.  History of a tonsillectomy.     Past Medical History:  Diagnosis Date   ADD (attention deficit disorder)    No meds since 2016   Anxiety    Asthma    BMI 28.0-28.9,adult    Eczema    GERD (gastroesophageal reflux disease)    Motion sickness    [address]    Patient Active Problem List   Diagnosis Date Noted   Obesity (BMI 30.0-34.9) 05/23/2022   Intermittent asthma without complication 05/23/2022   Anxiety 01/30/2016   History of recurrent UTIs 11/13/2014   ADD (attention deficit disorder) 05/25/2014   Dermatitis, eczematoid 05/25/2014   Acid reflux 05/25/2014   H/O herpes labialis 05/25/2014    Past Surgical History:  Procedure Laterality Date   NO PAST SURGERIES     NO PAST SURGERIES     TONSILLECTOMY     TONSILLECTOMY AND ADENOIDECTOMY N/A 03/21/2016   Procedure: TONSILLECTOMY AND ADENOIDECTOMY;  Surgeon: Bud Face, MD;  Location: [facility];  Service: ENT;  Laterality: N/A;  Latex sensitivity  adenoids cauterized no tissue sent    OB History     Gravida   1   Para      Term      Preterm      AB  1   Living         SAB  1   IAB      Ectopic      Multiple      Live Births               Home Medications    Prior to Admission medications   Medication Sig Start Date End Date Taking? Authorizing Provider  FLUoxetine (PROZAC) 20 MG capsule Take 20 mg by mouth daily. 09/26/22  Yes [provider]  albuterol (VENTOLIN HFA) 108 (90 Base) MCG/ACT inhaler Inhale 2 puffs into the lungs every 6 (six) hours as needed for wheezing or shortness of breath. 03/12/22   Ostwalt, Edmon Crape, PA-C  busPIRone (BUSPAR) 7.5 MG tablet Take 1 tablet (7.5 mg total) by mouth 2 (two) times daily. 05/21/22   Ostwalt, Edmon Crape, PA-C  Iron, Ferrous Sulfate, 325 (65 Fe) MG TABS Take 325 mg by mouth daily. 325 mg ferrous sulfate (65 mg elemental iron) orally once daily, after a meal with orange juice Patient not taking: Reported on 09/10/2022 05/25/22   Debera Lat, PA-C  omeprazole (PRILOSEC) 20 MG capsule Take 1 capsule (20 mg total) by  mouth daily. 03/12/22   Debera Lat, PA-C    Family History Family History  Problem Relation Age of Onset   Asthma Sister    Anxiety disorder Sister    Hypertension Maternal Grandmother    Diabetes Maternal Grandfather    Thyroid disease Paternal Grandmother     Social History Social History   Tobacco Use   Smoking status: Former    Current packs/day: 0.00    Types: Cigarettes, E-cigarettes    Quit date: 01/15/2018    Years since quitting: 4.7   Smokeless tobacco: Former   Tobacco comments:    Quit 4-5 months ago   Vaping Use   Vaping status: Every Day   Start date: 01/16/2019  Substance Use Topics   Alcohol use: No    Alcohol/week: 0.0 standard drinks of alcohol   Drug use: Not Currently    Types: Marijuana     Allergies   Cat hair extract, Dog fennel allergy skin test, Dust mite extract, Nitrofurantoin, Sulfa antibiotics, and Latex   Review of Systems Review of Systems  Constitutional:   Positive for fever. Negative for activity change, appetite change, chills, diaphoresis, fatigue and unexpected weight change.  HENT:  Positive for congestion, ear pain, rhinorrhea, sore throat and voice change. Negative for dental problem, drooling, ear discharge, facial swelling, hearing loss, mouth sores, nosebleeds, postnasal drip, sinus pressure, sinus pain, sneezing, tinnitus and trouble swallowing.   Respiratory:  Positive for cough. Negative for apnea, choking, chest tightness, shortness of breath, wheezing and stridor.   Cardiovascular: Negative.   Gastrointestinal: Negative.   Skin: Negative.   Neurological: Negative.      Physical Exam Triage Vital Signs ED Triage Vitals  Encounter Vitals Group     BP 10/04/22 1210 108/68     Systolic BP Percentile --      Diastolic BP Percentile --      Pulse Rate 10/04/22 1210 95     Resp 10/04/22 1210 18     Temp 10/04/22 1210 98.5 F (36.9 C)     Temp Source 10/04/22 1210 Oral     SpO2 10/04/22 1210 97 %     Weight --      Height --      Head Circumference --      Peak Flow --      Pain Score 10/04/22 1209 0     Pain Loc --      Pain Education --      Exclude from Growth Chart --    No data found.  Updated Vital Signs BP 108/68 (BP Location: Right Arm)   Pulse 95   Temp 98.5 F (36.9 C) (Oral)   Resp 18   LMP 09/30/2022 (Exact Date)   SpO2 97%   Visual Acuity Right Eye Distance:   Left Eye Distance:   Bilateral Distance:    Right Eye Near:   Left Eye Near:    Bilateral Near:     Physical Exam Constitutional:      Appearance: Normal appearance.  HENT:     Right Ear: Hearing, ear canal and external ear normal. Tympanic membrane is erythematous.     Left Ear: Hearing, tympanic membrane, ear canal and external ear normal.     Nose: Congestion present. No rhinorrhea.     Mouth/Throat:     Pharynx: No oropharyngeal exudate or posterior oropharyngeal erythema.  Eyes:     Extraocular Movements: Extraocular movements  intact.  Cardiovascular:     Rate and Rhythm:  Normal rate and regular rhythm.     Pulses: Normal pulses.     Heart sounds: Normal heart sounds.  Pulmonary:     Effort: Pulmonary effort is normal.     Breath sounds: Normal breath sounds.  Musculoskeletal:        General: Normal range of motion.     Cervical back: Normal range of motion and neck supple.  Skin:    General: Skin is warm and dry.  Neurological:     Mental Status: She is alert and oriented to person, place, and time. Mental status is at baseline.  Psychiatric:        Mood and Affect: Mood normal.        Behavior: Behavior normal.      UC Treatments / Results  Labs (all labs ordered are listed, but only abnormal results are displayed) Labs Reviewed - No data to display  EKG   Radiology No results found.  Procedures Procedures (including critical care time)  Medications Ordered in UC Medications - No data to display  Initial Impression / Assessment and Plan / UC Course  I have reviewed the triage vital signs and the nursing notes.  Pertinent labs & imaging results that were available during my care of the patient were reviewed by me and considered in my medical decision making (see chart for details).  Nonrecurrent acute serous otitis media of the right ear  Erythema present to the right tympanic membrane without abnormality to the canal or the external ear, congestion present within the nasal turbinates and while ill-appearing patient is in no signs of distress nor toxic, vitals are stable, imaging deferred as a low suspicion for pneumonia or bronchitis, prescribed amoxicillin for treatment as well as Tessalon and Promethazine DM as she endorses coughing fits, Diflucan sent prophylactically as she endorses yeast infections with use of antibiotics, may use over-the-counter medications as needed for supportive care, may follow-up with his urgent care as needed Final Clinical Impressions(s) / UC Diagnoses    Final diagnoses:  None   Discharge Instructions   None    ED Prescriptions   None    PDMP not reviewed this encounter.   Valinda Hoar, NP 10/04/22 1248

## 2022-10-04 NOTE — Discharge Instructions (Signed)
Today you are being treated for an infection of the eardrum  Take amoxicillin twice daily for 10 days, you should begin to see improvement after 48 hours of medication use and then it should progressively get better  You may use Tylenol or ibuprofen for management of discomfort  May hold warm compresses to the ear for additional comfort  Please not attempted any ear cleaning or object or fluid placement into the ear canal to prevent further irritation   May use Tessalon pill every 8 hours as needed to help calm coughing  May use cough syrup at bedtime for additional comfort, be mindful of this to make you feel sleepy   For cough: honey 1/2 to 1 teaspoon (you can dilute the honey in water or another fluid).  You can also use guaifenesin  for cough. You can use a humidifier for chest congestion and cough.  If you don't have a humidifier, you can sit in the bathroom with the hot shower running.      For sore throat: try warm salt water gargles, cepacol lozenges, throat spray, warm tea or water with lemon/honey, popsicles or ice, or OTC cold relief medicine for throat discomfort.   For congestion: take a daily anti-histamine like Zyrtec, Claritin, and a oral decongestant, such as pseudoephedrine.  You can also use Flonase 1-2 sprays in each nostril daily.   It is important to stay hydrated: drink plenty of fluids (water, gatorade/powerade/pedialyte, juices, or teas) to keep your throat moisturized and help further relieve irritation/discomfort.

## 2022-10-10 DIAGNOSIS — F902 Attention-deficit hyperactivity disorder, combined type: Secondary | ICD-10-CM | POA: Diagnosis not present

## 2022-10-10 DIAGNOSIS — F411 Generalized anxiety disorder: Secondary | ICD-10-CM | POA: Diagnosis not present

## 2022-10-10 DIAGNOSIS — F331 Major depressive disorder, recurrent, moderate: Secondary | ICD-10-CM | POA: Diagnosis not present

## 2022-10-15 ENCOUNTER — Ambulatory Visit
Admission: RE | Admit: 2022-10-15 | Discharge: 2022-10-15 | Disposition: A | Discharge status: Home / Self Care | Payer: Medicaid Other | Source: Ambulatory Visit | Attending: Family Medicine | Admitting: Family Medicine

## 2022-10-15 VITALS — BP 116/78 | HR 94 | Temp 97.8°F | Resp 17 | Wt 215.0 lb

## 2022-10-15 DIAGNOSIS — N3000 Acute cystitis without hematuria: Secondary | ICD-10-CM | POA: Diagnosis not present

## 2022-10-15 DIAGNOSIS — B9689 Other specified bacterial agents as the cause of diseases classified elsewhere: Secondary | ICD-10-CM

## 2022-10-15 DIAGNOSIS — N760 Acute vaginitis: Secondary | ICD-10-CM

## 2022-10-15 LAB — URINALYSIS, W/ REFLEX TO CULTURE (INFECTION SUSPECTED)
Bilirubin Urine: NEGATIVE
Glucose, UA: NEGATIVE mg/dL
Ketones, ur: NEGATIVE mg/dL
Nitrite: POSITIVE — AB
Specific Gravity, Urine: 1.025 (ref 1.005–1.030)
pH: 7 (ref 5.0–8.0)

## 2022-10-15 LAB — WET PREP, GENITAL
Sperm: NONE SEEN
Trich, Wet Prep: NONE SEEN
WBC, Wet Prep HPF POC: <10 — AB (ref <10)
Yeast Wet Prep HPF POC: NONE SEEN

## 2022-10-15 MED ORDER — CEFDINIR 300 MG PO CAPS: 300.0000 mg | ORAL_CAPSULE | Freq: Two times a day (BID) | ORAL | 0 refills | Status: AC

## 2022-10-15 MED ORDER — METRONIDAZOLE 500 MG PO TABS: 500.0000 mg | ORAL_TABLET | Freq: Two times a day (BID) | ORAL | 0 refills | Status: DC

## 2022-10-15 MED ORDER — FLUCONAZOLE 150 MG PO TABS: 150.0000 mg | ORAL_TABLET | ORAL | 0 refills | Status: AC

## 2022-10-15 NOTE — ED Provider Notes (Signed)
[facility]    CSN: [id number] Arrival date & time: 10/15/22  1750      History   Chief Complaint Chief Complaint  Patient presents with   UTI     HPI HPI Cindy Matthews is a 27 y.o. female.    Cindy Matthews presents for dysuria, decreased urination but frequent urination that started yesterday .  Tried nothing prior to arrival.  Has  not had any antibiotics in last 30 days.   Denies known STI exposure.  Cindy Matthews does not use condoms regularly. She is  not currently pregnant.  Patient's last menstrual period was 10/05/2022 (approximately).        Past Medical History:  Diagnosis Date   ADD (attention deficit disorder)    No meds since 2016   Anxiety    Asthma    BMI 28.0-28.9,adult    Eczema    GERD (gastroesophageal reflux disease)    Motion sickness    [address]    Patient Active Problem List   Diagnosis Date Noted   Obesity (BMI 30.0-34.9) 05/23/2022   Intermittent asthma without complication 05/23/2022   Anxiety 01/30/2016   History of recurrent UTIs 11/13/2014   ADD (attention deficit disorder) 05/25/2014   Dermatitis, eczematoid 05/25/2014   Acid reflux 05/25/2014   H/O herpes labialis 05/25/2014    Past Surgical History:  Procedure Laterality Date   NO PAST SURGERIES     NO PAST SURGERIES     TONSILLECTOMY     TONSILLECTOMY AND ADENOIDECTOMY N/A 03/21/2016   Procedure: TONSILLECTOMY AND ADENOIDECTOMY;  Surgeon: Bud Face, MD;  Location: [facility];  Service: ENT;  Laterality: N/A;  Latex sensitivity  adenoids cauterized no tissue sent    OB History     Gravida  1   Para      Term      Preterm      AB  1   Living         SAB  1   IAB      Ectopic      Multiple      Live Births               Home Medications    Prior to Admission medications   Medication Sig Start Date End Date Taking? Authorizing Provider  atomoxetine (STRATTERA) 40 MG capsule Take 40 mg by mouth every morning.  10/10/22  Yes [provider]  cefdinir (OMNICEF) 300 MG capsule Take 1 capsule (300 mg total) by mouth 2 (two) times daily for 5 days. 10/15/22 10/20/22 Yes Sharnita Bogucki, DO  fluconazole (DIFLUCAN) 150 MG tablet Take 1 tablet (150 mg total) by mouth every 3 (three) days for 2 doses. 10/15/22 10/19/22 Yes Ruthell Feigenbaum, DO  FLUoxetine (PROZAC) 20 MG capsule Take 20 mg by mouth daily. 09/26/22  Yes [provider]  metroNIDAZOLE (FLAGYL) 500 MG tablet Take 1 tablet (500 mg total) by mouth 2 (two) times daily. 10/15/22  Yes Kalandra Masters, DO  albuterol (VENTOLIN HFA) 108 (90 Base) MCG/ACT inhaler Inhale 2 puffs into the lungs every 6 (six) hours as needed for wheezing or shortness of breath. 03/12/22   Ostwalt, Edmon Crape, PA-C  benzonatate (TESSALON) 100 MG capsule Take 1 capsule (100 mg total) by mouth every 8 (eight) hours. 10/04/22   White, Elita Boone, NP  busPIRone (BUSPAR) 7.5 MG tablet Take 1 tablet (7.5 mg total) by mouth 2 (two) times daily. 05/21/22   Debera Lat, PA-C  Iron, Ferrous Sulfate, 325 (65 Fe) MG TABS Take 325 mg by mouth daily. 325 mg ferrous sulfate (65 mg elemental iron) orally once daily, after a meal with orange juice Patient not taking: Reported on 09/10/2022 05/25/22   Debera Lat, PA-C  omeprazole (PRILOSEC) 20 MG capsule Take 1 capsule (20 mg total) by mouth daily. 03/12/22   Debera Lat, PA-C  promethazine-dextromethorphan (PROMETHAZINE-DM) 6.25-15 MG/5ML syrup Take 5 mLs by mouth at bedtime as needed for cough. 10/04/22   Valinda Hoar, NP    Family History Family History  Problem Relation Age of Onset   Asthma Sister    Anxiety disorder Sister    Hypertension Maternal Grandmother    Diabetes Maternal Grandfather    Thyroid disease Paternal Grandmother     Social History Social History   Tobacco Use   Smoking status: Former    Current packs/day: 0.00    Types: Cigarettes, E-cigarettes    Quit date: 01/15/2018    Years since quitting: 4.7    Smokeless tobacco: Former   Tobacco comments:    Quit 4-5 months ago   Vaping Use   Vaping status: Every Day   Start date: 01/16/2019  Substance Use Topics   Alcohol use: No    Alcohol/week: 0.0 standard drinks of alcohol   Drug use: Not Currently    Types: Marijuana     Allergies   Cat hair extract, Dog fennel allergy skin test, Dust mite extract, Nitrofurantoin, Sulfa antibiotics, and Latex   Review of Systems Review of Systems: :negative unless otherwise stated in HPI.      Physical Exam Triage Vital Signs ED Triage Vitals  Encounter Vitals Group     BP 10/15/22 1805 116/78     Systolic BP Percentile --      Diastolic BP Percentile --      Pulse Rate 10/15/22 1805 94     Resp 10/15/22 1805 17     Temp 10/15/22 1805 97.8 F (36.6 C)     Temp Source 10/15/22 1805 Oral     SpO2 10/15/22 1805 98 %     Weight 10/15/22 1806 215 lb (97.5 kg)     Height --      Head Circumference --      Peak Flow --      Pain Score --      Pain Loc --      Pain Education --      Exclude from Growth Chart --    No data found.  Updated Vital Signs BP 116/78 (BP Location: Right Arm)   Pulse 94   Temp 97.8 F (36.6 C) (Oral)   Resp 17   Wt 97.5 kg   LMP 09/30/2022 (Exact Date)   SpO2 98%   BMI 33.18 kg/m   Visual Acuity Right Eye Distance:   Left Eye Distance:   Bilateral Distance:    Right Eye Near:   Left Eye Near:    Bilateral Near:     Physical Exam GEN: well appearing female in no acute distress  CVS: well perfused  RESP: speaking in full sentences without pause  GU: deferred, patient performed self swab     UC Treatments / Results  Labs (all labs ordered are listed, but only abnormal results are displayed) Labs Reviewed  WET PREP, GENITAL - Abnormal; Notable for the following components:      Result Value   Clue Cells Wet Prep HPF POC PRESENT (*)    WBC, Wet Prep  HPF POC <10 (*)    All other components within normal limits  URINALYSIS, W/ REFLEX TO  CULTURE (INFECTION SUSPECTED) - Abnormal; Notable for the following components:   APPearance CLOUDY (*)    Hgb urine dipstick TRACE (*)    Protein, ur TRACE (*)    Nitrite POSITIVE (*)    Leukocytes,Ua SMALL (*)    Non Squamous Epithelial PRESENT (*)    Bacteria, UA MANY (*)    All other components within normal limits  URINE CULTURE    EKG   Radiology No results found.  Procedures Procedures (including critical care time)  Medications Ordered in UC Medications - No data to display  Initial Impression / Assessment and Plan / UC Course  I have reviewed the triage vital signs and the nursing notes.  Pertinent labs & imaging results that were available during my care of the patient were reviewed by me and considered in my medical decision making (see chart for details).      Patient is a 27 y.o.Marland Kitchen female  who presents for dysuria.  Overall patient is well-appearing and afebrile.  Vital signs stable.  UA consistent with acute cystitis.   Hematuria not supported on microscopy.  Urine culture obtained.  Follow-up sensitivities and change antibiotics, if needed.  Wet prep showing evidence of bacterial vaginitis but no yeast vaginitis or trichomonas.  Self care instructions given including avoiding douching.  - Treatment: Cefdinir twice daily for 5 days.  Flagyl 500 BID x 7 days and advised patient to not drink alcohol while taking this medication.  - Diflucan for 2 doses for prevention of yeast infection   Return precautions including abdominal pain, fever, chills, nausea, or vomiting given. Discussed MDM, treatment plan and plan for follow-up with patient who agrees with plan.       Final Clinical Impressions(s) / UC Diagnoses   Final diagnoses:  Acute cystitis without hematuria  Bacterial vaginitis     Discharge Instructions      You had evidence of of a bacterial urine and vaginal infection today.  Stop by the pharmacy to pick up your prescriptions.  For your UTI:  Take cefdinir to times a day for the next 5 days  For BV/bacterial vaginosis: Take metronidazole twice a day for the next 7 days.  Do not drink any alcohol with taking this medication.  For yeast infection prevention: Take the first dose of Diflucan on your first day of symptoms and the last tablet after you complete your antibiotics  If your symptoms do not improve in the next 7 days, be sure to follow-up here or at your primary care provider office.  Go to the emergency department if you are having increasing pain, worsening vaginal bleeding or fever.      ED Prescriptions     Medication Sig Dispense Auth. Provider   cefdinir (OMNICEF) 300 MG capsule Take 1 capsule (300 mg total) by mouth 2 (two) times daily for 5 days. 10 capsule Latangela Mccomas, DO   metroNIDAZOLE (FLAGYL) 500 MG tablet Take 1 tablet (500 mg total) by mouth 2 (two) times daily. 14 tablet Ambree Frances, DO   fluconazole (DIFLUCAN) 150 MG tablet Take 1 tablet (150 mg total) by mouth every 3 (three) days for 2 doses. 2 tablet Katha Cabal, DO      PDMP not reviewed this encounter.   Katha Cabal, DO 10/15/22 1843

## 2022-10-15 NOTE — ED Triage Notes (Signed)
Sx started yesterday. Smell with urine and urine frequency. Asked to be checked for yeast and BV

## 2022-10-15 NOTE — Discharge Instructions (Addendum)
You had evidence of of a bacterial urine and vaginal infection today.  Stop by the pharmacy to pick up your prescriptions.  For your UTI: Take cefdinir to times a day for the next 5 days  For BV/bacterial vaginosis: Take metronidazole twice a day for the next 7 days.  Do not drink any alcohol with taking this medication.  For yeast infection prevention: Take the first dose of Diflucan on your first day of symptoms and the last tablet after you complete your antibiotics  If your symptoms do not improve in the next 7 days, be sure to follow-up here or at your primary care provider office.  Go to the emergency department if you are having increasing pain, worsening vaginal bleeding or fever.

## 2022-10-18 LAB — URINE CULTURE: Culture: >=100000 — AB

## 2022-11-07 DIAGNOSIS — F331 Major depressive disorder, recurrent, moderate: Secondary | ICD-10-CM | POA: Diagnosis not present

## 2022-11-07 DIAGNOSIS — F411 Generalized anxiety disorder: Secondary | ICD-10-CM | POA: Diagnosis not present

## 2022-11-07 DIAGNOSIS — F902 Attention-deficit hyperactivity disorder, combined type: Secondary | ICD-10-CM | POA: Diagnosis not present

## 2022-12-11 ENCOUNTER — Ambulatory Visit: Payer: Medicaid Other | Admitting: Physician Assistant

## 2022-12-18 ENCOUNTER — Ambulatory Visit
Admission: RE | Admit: 2022-12-18 | Discharge: 2022-12-18 | Disposition: A | Discharge status: Home / Self Care | Payer: Medicaid Other | Source: Ambulatory Visit | Attending: Physician Assistant | Admitting: Physician Assistant

## 2022-12-18 VITALS — BP 102/74 | HR 95 | Temp 98.4°F | Resp 17

## 2022-12-18 DIAGNOSIS — N390 Urinary tract infection, site not specified: Secondary | ICD-10-CM | POA: Diagnosis not present

## 2022-12-18 DIAGNOSIS — H9203 Otalgia, bilateral: Secondary | ICD-10-CM

## 2022-12-18 DIAGNOSIS — N760 Acute vaginitis: Secondary | ICD-10-CM | POA: Diagnosis not present

## 2022-12-18 DIAGNOSIS — J069 Acute upper respiratory infection, unspecified: Secondary | ICD-10-CM

## 2022-12-18 DIAGNOSIS — B9689 Other specified bacterial agents as the cause of diseases classified elsewhere: Secondary | ICD-10-CM | POA: Diagnosis not present

## 2022-12-18 LAB — URINALYSIS, W/ REFLEX TO CULTURE (INFECTION SUSPECTED)
Bilirubin Urine: NEGATIVE
Glucose, UA: NEGATIVE mg/dL
Ketones, ur: 15 mg/dL — AB
Nitrite: NEGATIVE
Protein, ur: 100 mg/dL — AB
Specific Gravity, Urine: >1.03 — ABNORMAL HIGH (ref 1.005–1.030)
pH: 5.5 (ref 5.0–8.0)

## 2022-12-18 LAB — WET PREP, GENITAL
Sperm: NONE SEEN
Trich, Wet Prep: NONE SEEN
WBC, Wet Prep HPF POC: <10 (ref <10)
Yeast Wet Prep HPF POC: NONE SEEN

## 2022-12-18 MED ORDER — METRONIDAZOLE 500 MG PO TABS: 500.0000 mg | ORAL_TABLET | Freq: Two times a day (BID) | ORAL | 0 refills | Status: AC

## 2022-12-18 MED ORDER — CEFUROXIME AXETIL 500 MG PO TABS: 500.0000 mg | ORAL_TABLET | Freq: Two times a day (BID) | ORAL | 0 refills | Status: AC

## 2022-12-18 NOTE — ED Triage Notes (Signed)
Patient states that she has burning while urinating. Vaginal itching and discomfort. Sx since Thursday.  No discharge.patient states that she gets reoccurring BV and would like to be tested for that. No concern for STD. Patient states that her right ear hurts that started Sunday. Congestion. Right ear feels full.

## 2022-12-18 NOTE — ED Provider Notes (Signed)
[facility]    CSN: [id number] Arrival date & time: 12/18/22  1612      History   Chief Complaint Chief Complaint  Patient presents with   Vaginal Itching   Urinary Frequency   Otalgia    HPI SUBRENA Cindy Matthews is a 27 y.o. female.   27 year old female, Cindy Matthews, presents to urgent care for evaluation of burning with urination, vaginal itching since Thursday.  Patient reports history of BV no concern for STI.  Patient also complaining of right ear pain and congestion x 2 days. Recent exposure to URI per pt report.  The history is provided by the patient. No language interpreter was used.    Past Medical History:  Diagnosis Date   ADD (attention deficit disorder)    No meds since 2016   Anxiety    Asthma    BMI 28.0-28.9,adult    Eczema    GERD (gastroesophageal reflux disease)    Motion sickness    [address]    Patient Active Problem List   Diagnosis Date Noted   BV (bacterial vaginosis) 12/18/2022   Acute UTI 12/18/2022   Viral URI 12/18/2022   Otalgia of both ears 12/18/2022   Obesity (BMI 30.0-34.9) 05/23/2022   Intermittent asthma without complication 05/23/2022   Anxiety 01/30/2016   History of recurrent UTIs 11/13/2014   ADD (attention deficit disorder) 05/25/2014   Dermatitis, eczematoid 05/25/2014   Acid reflux 05/25/2014   H/O herpes labialis 05/25/2014    Past Surgical History:  Procedure Laterality Date   NO PAST SURGERIES     NO PAST SURGERIES     TONSILLECTOMY     TONSILLECTOMY AND ADENOIDECTOMY N/A 03/21/2016   Procedure: TONSILLECTOMY AND ADENOIDECTOMY;  Surgeon: Bud Face, MD;  Location: [facility];  Service: ENT;  Laterality: N/A;  Latex sensitivity  adenoids cauterized no tissue sent    OB History     Gravida  1   Para      Term      Preterm      AB  1   Living         SAB  1   IAB      Ectopic      Multiple      Live Births               Home Medications    Prior to  Admission medications   Medication Sig Start Date End Date Taking? Authorizing Provider  atomoxetine (STRATTERA) 40 MG capsule Take 40 mg by mouth every morning. 10/10/22  Yes [provider]  cefUROXime (CEFTIN) 500 MG tablet Take 1 tablet (500 mg total) by mouth 2 (two) times daily with a meal for 7 days. 12/18/22 12/25/22 Yes Jaret Coppedge, Para March, NP  FLUoxetine (PROZAC) 20 MG capsule Take 20 mg by mouth daily. 09/26/22  Yes [provider]  metroNIDAZOLE (FLAGYL) 500 MG tablet Take 1 tablet (500 mg total) by mouth 2 (two) times daily for 7 days. 12/18/22 12/25/22 Yes Zakiya Sporrer, Para March, NP  albuterol (VENTOLIN HFA) 108 (90 Base) MCG/ACT inhaler Inhale 2 puffs into the lungs every 6 (six) hours as needed for wheezing or shortness of breath. 03/12/22   Ostwalt, Edmon Crape, PA-C  benzonatate (TESSALON) 100 MG capsule Take 1 capsule (100 mg total) by mouth every 8 (eight) hours. 10/04/22   White, Elita Boone, NP  busPIRone (BUSPAR) 7.5 MG tablet Take 1 tablet (7.5 mg total) by mouth 2 (two) times daily. 05/21/22  Ostwalt, Janna, PA-C  Iron, Ferrous Sulfate, 325 (65 Fe) MG TABS Take 325 mg by mouth daily. 325 mg ferrous sulfate (65 mg elemental iron) orally once daily, after a meal with orange juice Patient not taking: Reported on 09/10/2022 05/25/22   Debera Lat, PA-C  omeprazole (PRILOSEC) 20 MG capsule Take 1 capsule (20 mg total) by mouth daily. 03/12/22   Debera Lat, PA-C  promethazine-dextromethorphan (PROMETHAZINE-DM) 6.25-15 MG/5ML syrup Take 5 mLs by mouth at bedtime as needed for cough. 10/04/22   Valinda Hoar, NP    Family History Family History  Problem Relation Age of Onset   Asthma Sister    Anxiety disorder Sister    Hypertension Maternal Grandmother    Diabetes Maternal Grandfather    Thyroid disease Paternal Grandmother     Social History Social History   Tobacco Use   Smoking status: Former    Current packs/day: 0.00    Types: Cigarettes, E-cigarettes     Quit date: 01/15/2018    Years since quitting: 4.9   Smokeless tobacco: Former   Tobacco comments:    Quit 4-5 months ago   Vaping Use   Vaping status: Every Day   Start date: 01/16/2019  Substance Use Topics   Alcohol use: No    Alcohol/week: 0.0 standard drinks of alcohol   Drug use: Not Currently    Types: Marijuana     Allergies   Cat hair extract, Dog fennel allergy skin test, Dust mite extract, Nitrofurantoin, Sulfa antibiotics, and Latex   Review of Systems Review of Systems  Constitutional:  Negative for fever.  HENT:  Positive for congestion and ear pain. Negative for ear discharge.   Gastrointestinal:  Negative for abdominal pain.  Genitourinary:  Positive for dysuria and frequency. Negative for vaginal discharge.       Vaginal itching  All other systems reviewed and are negative.    Physical Exam Triage Vital Signs ED Triage Vitals  Encounter Vitals Group     BP 12/18/22 1629 102/74     Systolic BP Percentile --      Diastolic BP Percentile --      Pulse Rate 12/18/22 1629 95     Resp 12/18/22 1629 17     Temp 12/18/22 1629 98.4 F (36.9 C)     Temp src --      SpO2 12/18/22 1629 100 %     Weight --      Height --      Head Circumference --      Peak Flow --      Pain Score 12/18/22 1628 0     Pain Loc --      Pain Education --      Exclude from Growth Chart --    No data found.  Updated Vital Signs BP 102/74 (BP Location: Left Arm)   Pulse 95   Temp 98.4 F (36.9 C)   Resp 17   LMP 11/27/2022 (Approximate)   SpO2 100%   Visual Acuity Right Eye Distance:   Left Eye Distance:   Bilateral Distance:    Right Eye Near:   Left Eye Near:    Bilateral Near:     Physical Exam Vitals and nursing note reviewed.  Constitutional:      General: She is not in acute distress.    Appearance: She is well-developed.  HENT:     Head: Normocephalic and atraumatic.     Right Ear: Tympanic membrane is retracted.  Left Ear: Tympanic membrane is  retracted.     Nose: Mucosal edema and congestion present.     Mouth/Throat:     Lips: Pink.     Mouth: Mucous membranes are moist.     Pharynx: Oropharynx is clear. Uvula midline.  Eyes:     Conjunctiva/sclera: Conjunctivae normal.  Cardiovascular:     Rate and Rhythm: Normal rate and regular rhythm.     Pulses: Normal pulses.     Heart sounds: Normal heart sounds. No murmur heard. Pulmonary:     Effort: Pulmonary effort is normal. No respiratory distress.     Breath sounds: Normal breath sounds and air entry.  Abdominal:     Palpations: Abdomen is soft.     Tenderness: There is no abdominal tenderness.  Musculoskeletal:        General: No swelling.     Cervical back: Neck supple.  Skin:    General: Skin is warm and dry.     Capillary Refill: Capillary refill takes less than 2 seconds.  Neurological:     Mental Status: She is alert.  Psychiatric:        Mood and Affect: Mood normal.      UC Treatments / Results  Labs (all labs ordered are listed, but only abnormal results are displayed) Labs Reviewed  WET PREP, GENITAL - Abnormal; Notable for the following components:      Result Value   Clue Cells Wet Prep HPF POC PRESENT (*)    All other components within normal limits  URINALYSIS, W/ REFLEX TO CULTURE (INFECTION SUSPECTED) - Abnormal; Notable for the following components:   APPearance HAZY (*)    Specific Gravity, Urine >1.030 (*)    Hgb urine dipstick MODERATE (*)    Ketones, ur 15 (*)    Protein, ur 100 (*)    Leukocytes,Ua SMALL (*)    Bacteria, UA FEW (*)    All other components within normal limits  URINE CULTURE    EKG   Radiology No results found.  Procedures Procedures (including critical care time)  Medications Ordered in UC Medications - No data to display  Initial Impression / Assessment and Plan / UC Course  I have reviewed the triage vital signs and the nursing notes.  Pertinent labs & imaging results that were available during my  care of the patient were reviewed by me and considered in my medical decision making (see chart for details).  Clinical Course as of 12/18/22 1734  Tue Dec 18, 2022  1703 UA is positive for UTI: +ketones, small leuk 11-20,protein 100,will treat for UTI, culture pending; + for BV,will treat, LMP 11/27/22 [JD]    Clinical Course User Index [JD] Eural Holzschuh, Para March, NP   Discussed exam findings and plan of care with patient, treating for BV and UTI with Flagyl and Ceftin , urine culture pending , strict go to ER precautions given.   Patient verbalized understanding to this provider.  Ddx: BV, UTI, viral URI, otalgia, allergies Final Clinical Impressions(s) / UC Diagnoses   Final diagnoses:  BV (bacterial vaginosis)  Acute UTI  Viral URI  Otalgia of both ears     Discharge Instructions      You urine is positive for UTI, + for BV. Take antibiotic as directed. Do not drink alcohol while taking flagyl. Drink plenty of water, follow up with PCP.   Go to Er for new or worsening issues or concerns(fever, nausea, vomiting, unable to keep meds down, muscle aches, etc)  ED Prescriptions     Medication Sig Dispense Auth. Provider   metroNIDAZOLE (FLAGYL) 500 MG tablet Take 1 tablet (500 mg total) by mouth 2 (two) times daily for 7 days. 14 tablet Tallie Hevia, NP   cefUROXime (CEFTIN) 500 MG tablet Take 1 tablet (500 mg total) by mouth 2 (two) times daily with a meal for 7 days. 14 tablet Awa Bachicha, Para March, NP      PDMP not reviewed this encounter.   Clancy Gourd, NP 12/18/22 1734

## 2022-12-18 NOTE — Discharge Instructions (Addendum)
You urine is positive for UTI, + for BV. Take antibiotic as directed. Do not drink alcohol while taking flagyl. Drink plenty of water, follow up with PCP.   Go to Er for new or worsening issues or concerns(fever, nausea, vomiting, unable to keep meds down, muscle aches, etc)

## 2022-12-19 ENCOUNTER — Encounter: Payer: Self-pay | Admitting: Physician Assistant

## 2022-12-19 DIAGNOSIS — F411 Generalized anxiety disorder: Secondary | ICD-10-CM | POA: Diagnosis not present

## 2022-12-19 DIAGNOSIS — F902 Attention-deficit hyperactivity disorder, combined type: Secondary | ICD-10-CM | POA: Diagnosis not present

## 2022-12-19 DIAGNOSIS — F331 Major depressive disorder, recurrent, moderate: Secondary | ICD-10-CM | POA: Diagnosis not present

## 2022-12-20 LAB — URINE CULTURE: Culture: >=100000 — AB

## 2022-12-22 ENCOUNTER — Telehealth: Payer: Self-pay | Admitting: Physician Assistant

## 2022-12-22 MED ORDER — FLUCONAZOLE 150 MG PO TABS: ORAL_TABLET | ORAL | 0 refills | Status: DC

## 2022-12-22 NOTE — Telephone Encounter (Signed)
Patient currently on his antibiotics for UTI and BV.  Requests Diflucan for possible yeast infection.  History of yeast infections with antibiotic use.  Sent to Diflucan to pharmacy

## 2022-12-23 NOTE — Progress Notes (Unsigned)
Established patient visit  [name]   DOB: [birth year]   27 y.o. Female  MRN: [id number] Visit Date: 12/28/2022  Today's healthcare provider: Debera Lat, PA-C   No chief complaint on file.  Subjective    HPI  *** Discussed the use of AI scribe software for clinical note transcription with the patient, who gave verbal consent to proceed.  History of Present Illness               09/10/2022    2:22 PM 06/19/2022    2:57 PM 05/21/2022    3:15 PM  Depression screen PHQ 2/9  Decreased Interest 2 0 0  Down, Depressed, Hopeless 2 0 0  PHQ - 2 Score 4 0 0  Altered sleeping 2 0 0  Tired, decreased energy 2 1 2   Change in appetite 2 0 1  Feeling bad or failure about yourself  2 0 0  Trouble concentrating 2 1 3   Moving slowly or fidgety/restless 2 0 0  Suicidal thoughts 0 0 0  PHQ-9 Score 16 2 6   Difficult doing work/chores Somewhat difficult Not difficult at all Not difficult at all      09/10/2022    2:22 PM 05/21/2022    3:16 PM 03/12/2022    2:21 PM  GAD 7 : Generalized Anxiety Score  Nervous, Anxious, on Edge 2 2 1   Control/stop worrying 2 3 2   Worry too much - different things 2 2 3   Trouble relaxing 2 0 2  Restless 2 0 2  Easily annoyed or irritable 2 1 3   Afraid - awful might happen 2 0 3  Total GAD 7 Score 14 8 16   Anxiety Difficulty Somewhat difficult Not difficult at all Very difficult    Medications: Outpatient Medications Prior to Visit  Medication Sig   albuterol (VENTOLIN HFA) 108 (90 Base) MCG/ACT inhaler Inhale 2 puffs into the lungs every 6 (six) hours as needed for wheezing or shortness of breath.   atomoxetine (STRATTERA) 40 MG capsule Take 40 mg by mouth every morning.   benzonatate (TESSALON) 100 MG capsule Take 1 capsule (100 mg total) by mouth every 8 (eight) hours.   busPIRone (BUSPAR) 7.5 MG tablet Take 1 tablet (7.5 mg total) by mouth 2 (two) times daily.   cefUROXime (CEFTIN) 500 MG tablet Take 1 tablet (500 mg total) by mouth 2  (two) times daily with a meal for 7 days.   fluconazole (DIFLUCAN) 150 MG tablet Take 1 tab po q72 hr prn yeast infection   FLUoxetine (PROZAC) 20 MG capsule Take 20 mg by mouth daily.   Iron, Ferrous Sulfate, 325 (65 Fe) MG TABS Take 325 mg by mouth daily. 325 mg ferrous sulfate (65 mg elemental iron) orally once daily, after a meal with orange juice (Patient not taking: Reported on 09/10/2022)   metroNIDAZOLE (FLAGYL) 500 MG tablet Take 1 tablet (500 mg total) by mouth 2 (two) times daily for 7 days.   omeprazole (PRILOSEC) 20 MG capsule Take 1 capsule (20 mg total) by mouth daily.   promethazine-dextromethorphan (PROMETHAZINE-DM) 6.25-15 MG/5ML syrup Take 5 mLs by mouth at bedtime as needed for cough.   No facility-administered medications prior to visit.    Review of Systems Except see HPI   {Insert previous labs (optional):23779} {See past labs  Heme  Chem  Endocrine  Serology  Results Review (optional):1}   Objective    LMP 11/27/2022 (Approximate)  {Insert last BP/Wt (optional):23777}{See vitals history (optional):1}   Physical  Exam   No results found for any visits on 12/28/22.  Assessment & Plan    *** Assessment and Plan              No follow-ups on file.      [facility]

## 2022-12-28 ENCOUNTER — Ambulatory Visit: Payer: Medicaid Other | Admitting: Physician Assistant

## 2023-01-03 ENCOUNTER — Ambulatory Visit: Payer: Medicaid Other | Admitting: Physician Assistant

## 2023-01-03 NOTE — Progress Notes (Deleted)
Established patient visit  [name]   DOB: 10/26/1995   27 y.o. Female  MRN: [id number] Visit Date: 01/03/2023  Today's healthcare provider: Debera Lat, PA-C   No chief complaint on file.  Subjective       Discussed the use of AI scribe software for clinical note transcription with the patient, who gave verbal consent to proceed.  History of Present Illness         Uti diflucan      09/10/2022    2:22 PM 06/19/2022    2:57 PM 05/21/2022    3:15 PM  Depression screen PHQ 2/9  Decreased Interest 2 0 0  Down, Depressed, Hopeless 2 0 0  PHQ - 2 Score 4 0 0  Altered sleeping 2 0 0  Tired, decreased energy 2 1 2   Change in appetite 2 0 1  Feeling bad or failure about yourself  2 0 0  Trouble concentrating 2 1 3   Moving slowly or fidgety/restless 2 0 0  Suicidal thoughts 0 0 0  PHQ-9 Score 16 2 6   Difficult doing work/chores Somewhat difficult Not difficult at all Not difficult at all      09/10/2022    2:22 PM 05/21/2022    3:16 PM 03/12/2022    2:21 PM  GAD 7 : Generalized Anxiety Score  Nervous, Anxious, on Edge 2 2 1   Control/stop worrying 2 3 2   Worry too much - different things 2 2 3   Trouble relaxing 2 0 2  Restless 2 0 2  Easily annoyed or irritable 2 1 3   Afraid - awful might happen 2 0 3  Total GAD 7 Score 14 8 16   Anxiety Difficulty Somewhat difficult Not difficult at all Very difficult    Medications: Outpatient Medications Prior to Visit  Medication Sig   albuterol (VENTOLIN HFA) 108 (90 Base) MCG/ACT inhaler Inhale 2 puffs into the lungs every 6 (six) hours as needed for wheezing or shortness of breath.   atomoxetine (STRATTERA) 40 MG capsule Take 40 mg by mouth every morning.   benzonatate (TESSALON) 100 MG capsule Take 1 capsule (100 mg total) by mouth every 8 (eight) hours.   busPIRone (BUSPAR) 7.5 MG tablet Take 1 tablet (7.5 mg total) by mouth 2 (two) times daily.   fluconazole (DIFLUCAN) 150 MG tablet Take 1 tab po q72 hr prn yeast  infection   FLUoxetine (PROZAC) 20 MG capsule Take 20 mg by mouth daily.   Iron, Ferrous Sulfate, 325 (65 Fe) MG TABS Take 325 mg by mouth daily. 325 mg ferrous sulfate (65 mg elemental iron) orally once daily, after a meal with orange juice (Patient not taking: Reported on 09/10/2022)   omeprazole (PRILOSEC) 20 MG capsule Take 1 capsule (20 mg total) by mouth daily.   promethazine-dextromethorphan (PROMETHAZINE-DM) 6.25-15 MG/5ML syrup Take 5 mLs by mouth at bedtime as needed for cough.   No facility-administered medications prior to visit.    Review of Systems  All other systems reviewed and are negative.  All negative Except see HPI   {Insert previous labs (optional):23779} {See past labs  Heme  Chem  Endocrine  Serology  Results Review (optional):1}   Objective    LMP 11/27/2022 (Approximate)  {Insert last BP/Wt (optional):23777}{See vitals history (optional):1}   Physical Exam Vitals reviewed.  Constitutional:      General: She is not in acute distress.    Appearance: Normal appearance. She is well-developed. She is not diaphoretic.  HENT:  Head: Normocephalic and atraumatic.  Eyes:     General: No scleral icterus.    Conjunctiva/sclera: Conjunctivae normal.  Neck:     Thyroid: No thyromegaly.  Cardiovascular:     Rate and Rhythm: Normal rate and regular rhythm.     Pulses: Normal pulses.     Heart sounds: Normal heart sounds. No murmur heard. Pulmonary:     Effort: Pulmonary effort is normal. No respiratory distress.     Breath sounds: Normal breath sounds. No wheezing, rhonchi or rales.  Musculoskeletal:     Cervical back: Neck supple.     Right lower leg: No edema.     Left lower leg: No edema.  Lymphadenopathy:     Cervical: No cervical adenopathy.  Skin:    General: Skin is warm and dry.     Findings: No rash.  Neurological:     Mental Status: She is alert and oriented to person, place, and time. Mental status is at baseline.  Psychiatric:         Mood and Affect: Mood normal.        Behavior: Behavior normal.      No results found for any visits on 01/03/23.      Assessment and Plan             No orders of the defined types were placed in this encounter.   No follow-ups on file.   The patient was advised to call back or seek an in-person evaluation if the symptoms worsen or if the condition fails to improve as anticipated.  I discussed the assessment and treatment plan with the patient. The patient was provided an opportunity to ask questions and all were answered. The patient agreed with the plan and demonstrated an understanding of the instructions.  I, Debera Lat, PA-C have reviewed all documentation for this visit. The documentation on 01/03/2023  for the exam, diagnosis, procedures, and orders are all accurate and complete.  Debera Lat, [facility], MMS [facility] [phone] (phone) [phone] (fax)  [facility]

## 2023-04-12 ENCOUNTER — Ambulatory Visit
Admission: RE | Admit: 2023-04-12 | Discharge: 2023-04-12 | Disposition: A | Discharge status: Home / Self Care | Payer: Self-pay | Source: Ambulatory Visit | Attending: Emergency Medicine | Admitting: Emergency Medicine

## 2023-04-12 VITALS — BP 136/81 | HR 80 | Temp 98.3°F | Resp 14 | Ht 67.5 in | Wt 214.9 lb

## 2023-04-12 DIAGNOSIS — N898 Other specified noninflammatory disorders of vagina: Secondary | ICD-10-CM | POA: Diagnosis not present

## 2023-04-12 DIAGNOSIS — Z3202 Encounter for pregnancy test, result negative: Secondary | ICD-10-CM

## 2023-04-12 DIAGNOSIS — N390 Urinary tract infection, site not specified: Secondary | ICD-10-CM | POA: Diagnosis not present

## 2023-04-12 LAB — URINALYSIS, W/ REFLEX TO CULTURE (INFECTION SUSPECTED)
Bilirubin Urine: NEGATIVE
Glucose, UA: NEGATIVE mg/dL
Hgb urine dipstick: NEGATIVE
Nitrite: NEGATIVE
Protein, ur: NEGATIVE mg/dL
Specific Gravity, Urine: 1.025 (ref 1.005–1.030)
pH: 7 (ref 5.0–8.0)

## 2023-04-12 LAB — PREGNANCY, URINE: Preg Test, Ur: NEGATIVE

## 2023-04-12 MED ORDER — CEFUROXIME AXETIL 500 MG PO TABS: 500.0000 mg | ORAL_TABLET | Freq: Two times a day (BID) | ORAL | 0 refills | Status: AC

## 2023-04-12 NOTE — ED Triage Notes (Signed)
 Patient here for possible UTI and vaginal discharge that started today.  Patient reports vaginal itching that started yesterday.

## 2023-04-12 NOTE — Discharge Instructions (Signed)
 We are treating you for uti, take antibiotic as directed.  Results pending on aptima swab, check my chart for results Avoid sexual activity until results,treatment known and completed. Safe sex with all future sexual activity. We have sent testing for sexually transmitted infections, BV, yeast. We will notify you of any positive results once they are received. If required, we will prescribe any medications you might need.   Go to ER if you develop fever,nausea, abdominal pain or fever over 101.

## 2023-04-12 NOTE — ED Provider Notes (Signed)
 [facility]    CSN: [id number] Arrival date & time: 04/12/23  1339      History   Chief Complaint Chief Complaint  Patient presents with   Vaginal Itching    Appointment    HPI Cindy Matthews is a 28 y.o. female.   Cindy Matthews, 28 year old female, presents to urgent care for evaluation of possible UTI and vaginal discharge that started today.  Patient reports vaginal itching started yesterday, pt reports same partner x 7 months.  The history is provided by the patient. No language interpreter was used.    Past Medical History:  Diagnosis Date   ADD (attention deficit disorder)    No meds since 2016   Anxiety    Asthma    BMI 28.0-28.9,adult    Eczema    GERD (gastroesophageal reflux disease)    Motion sickness    [address]    Patient Active Problem List   Diagnosis Date Noted   Vaginal itching 04/12/2023   BV (bacterial vaginosis) 12/18/2022   Acute UTI 12/18/2022   Viral URI 12/18/2022   Otalgia of both ears 12/18/2022   Obesity (BMI 30.0-34.9) 05/23/2022   Intermittent asthma without complication 05/23/2022   Anxiety 01/30/2016   History of recurrent UTIs 11/13/2014   ADD (attention deficit disorder) 05/25/2014   Dermatitis, eczematoid 05/25/2014   Acid reflux 05/25/2014   H/O herpes labialis 05/25/2014    Past Surgical History:  Procedure Laterality Date   NO PAST SURGERIES     NO PAST SURGERIES     TONSILLECTOMY     TONSILLECTOMY AND ADENOIDECTOMY N/A 03/21/2016   Procedure: TONSILLECTOMY AND ADENOIDECTOMY;  Surgeon: Bud Face, MD;  Location: [facility];  Service: ENT;  Laterality: N/A;  Latex sensitivity  adenoids cauterized no tissue sent    OB History     Gravida  1   Para      Term      Preterm      AB  1   Living         SAB  1   IAB      Ectopic      Multiple      Live Births               Home Medications    Prior to Admission medications   Medication Sig Start Date End  Date Taking? Authorizing Provider  cefUROXime (CEFTIN) 500 MG tablet Take 1 tablet (500 mg total) by mouth 2 (two) times daily with a meal for 7 days. 04/12/23 04/19/23 Yes Haruo Stepanek, Para March, NP  albuterol (VENTOLIN HFA) 108 (90 Base) MCG/ACT inhaler Inhale 2 puffs into the lungs every 6 (six) hours as needed for wheezing or shortness of breath. 03/12/22   Ostwalt, Edmon Crape, PA-C  atomoxetine (STRATTERA) 40 MG capsule Take 40 mg by mouth every morning. 10/10/22   [provider]  benzonatate (TESSALON) 100 MG capsule Take 1 capsule (100 mg total) by mouth every 8 (eight) hours. 10/04/22   White, Elita Boone, NP  busPIRone (BUSPAR) 7.5 MG tablet Take 1 tablet (7.5 mg total) by mouth 2 (two) times daily. 05/21/22   Debera Lat, PA-C  fluconazole (DIFLUCAN) 150 MG tablet Take 1 tab po q72 hr prn yeast infection 12/22/22   Eusebio Friendly B, PA-C  FLUoxetine (PROZAC) 20 MG capsule Take 20 mg by mouth daily. 09/26/22   [provider]  Iron, Ferrous Sulfate, 325 (65 Fe) MG TABS Take 325 mg  by mouth daily. 325 mg ferrous sulfate (65 mg elemental iron) orally once daily, after a meal with orange juice Patient not taking: Reported on 09/10/2022 05/25/22   Debera Lat, PA-C  omeprazole (PRILOSEC) 20 MG capsule Take 1 capsule (20 mg total) by mouth daily. 03/12/22   Debera Lat, PA-C  promethazine-dextromethorphan (PROMETHAZINE-DM) 6.25-15 MG/5ML syrup Take 5 mLs by mouth at bedtime as needed for cough. 10/04/22   Valinda Hoar, NP    Family History Family History  Problem Relation Age of Onset   Asthma Sister    Anxiety disorder Sister    Hypertension Maternal Grandmother    Diabetes Maternal Grandfather    Thyroid disease Paternal Grandmother     Social History Social History   Tobacco Use   Smoking status: Former    Current packs/day: 0.00    Types: Cigarettes, E-cigarettes    Quit date: 01/15/2018    Years since quitting: 5.2   Smokeless tobacco: Former   Tobacco comments:     Quit 4-5 months ago   Vaping Use   Vaping status: Every Day   Start date: 01/16/2019  Substance Use Topics   Alcohol use: No    Alcohol/week: 0.0 standard drinks of alcohol   Drug use: Not Currently    Types: Marijuana     Allergies   Cat dander, Dog fennel allergy skin test, Dust mite extract, Nitrofurantoin, Sulfa antibiotics, and Latex   Review of Systems Review of Systems  Constitutional:  Negative for fever.  Gastrointestinal:  Negative for abdominal pain.  Genitourinary:  Positive for dysuria and vaginal discharge.  Musculoskeletal:  Negative for back pain.  All other systems reviewed and are negative.    Physical Exam Triage Vital Signs ED Triage Vitals  Encounter Vitals Group     BP 04/12/23 1409 136/81     Systolic BP Percentile --      Diastolic BP Percentile --      Pulse Rate 04/12/23 1409 80     Resp 04/12/23 1409 14     Temp 04/12/23 1409 98.3 F (36.8 C)     Temp Source 04/12/23 1409 Oral     SpO2 04/12/23 1409 98 %     Weight 04/12/23 1406 214 lb 15.2 oz (97.5 kg)     Height 04/12/23 1406 5' 7.5" (1.715 m)     Head Circumference --      Peak Flow --      Pain Score 04/12/23 1406 0     Pain Loc --      Pain Education --      Exclude from Growth Chart --    No data found.  Updated Vital Signs BP 136/81 (BP Location: Right Arm)   Pulse 80   Temp 98.3 F (36.8 C) (Oral)   Resp 14   Ht 5' 7.5" (1.715 m)   Wt 214 lb 15.2 oz (97.5 kg)   LMP 03/29/2023 (Exact Date)   SpO2 98%   BMI 33.17 kg/m   Visual Acuity Right Eye Distance:   Left Eye Distance:   Bilateral Distance:    Right Eye Near:   Left Eye Near:    Bilateral Near:     Physical Exam Vitals and nursing note reviewed.  Constitutional:      General: Cindy Matthews is not in acute distress.    Appearance: Cindy Matthews is well-developed and well-groomed.  HENT:     Head: Normocephalic and atraumatic.  Eyes:     Conjunctiva/sclera: Conjunctivae normal.  Cardiovascular:  Rate and Rhythm: Normal  rate.     Heart sounds: No murmur heard. Pulmonary:     Effort: Pulmonary effort is normal. No respiratory distress.  Abdominal:     Palpations: Abdomen is soft.     Tenderness: There is no abdominal tenderness.  Genitourinary:    Comments: Deferred pt self swabbed Musculoskeletal:        General: No swelling.     Cervical back: Neck supple.  Skin:    General: Skin is warm and dry.     Capillary Refill: Capillary refill takes less than 2 seconds.  Neurological:     General: No focal deficit present.     Mental Status: Cindy Matthews is alert and oriented to person, place, and time.     GCS: GCS eye subscore is 4. GCS verbal subscore is 5. GCS motor subscore is 6.  Psychiatric:        Attention and Perception: Attention normal.        Mood and Affect: Mood normal.        Speech: Speech normal.        Behavior: Behavior normal. Behavior is cooperative.      UC Treatments / Results  Labs (all labs ordered are listed, but only abnormal results are displayed) Labs Reviewed  URINALYSIS, W/ REFLEX TO CULTURE (INFECTION SUSPECTED) - Abnormal; Notable for the following components:      Result Value   APPearance HAZY (*)    Ketones, ur TRACE (*)    Leukocytes,Ua TRACE (*)    Bacteria, UA FEW (*)    All other components within normal limits  PREGNANCY, URINE  CERVICOVAGINAL ANCILLARY ONLY    EKG   Radiology No results found.  Procedures Procedures (including critical care time)  Medications Ordered in UC Medications - No data to display  Initial Impression / Assessment and Plan / UC Course  I have reviewed the triage vital signs and the nursing notes.  Pertinent labs & imaging results that were available during my care of the patient were reviewed by me and considered in my medical decision making (see chart for details).  Clinical Course as of 04/12/23 1805  Fri Apr 12, 2023  1457 Ua has trace leukocytes, trace ketones, few bacteria, contaminated specimen, will treat for UTI,  neg pregnancy, aptima swab pending, if further treatment is indicated pt will be notified, safe sex precautions in meantime. [JD]    Clinical Course User Index [JD] Searcy Miyoshi, Para March, NP  Discussed exam findings and plan of care with patient, ceftin scripted for UTI, safe sex precautions, check my chart for results, strict go to ER precautions given.   Patient verbalized understanding to this provider.  Ddx: Acute uti, vaginal discharge, vaginal itching, yeast,BV, , STI Final Clinical Impressions(s) / UC Diagnoses   Final diagnoses:  Acute UTI  Vaginal discharge  Vaginal itching     Discharge Instructions      We are treating you for uti, take antibiotic as directed.  Results pending on aptima swab, check my chart for results Avoid sexual activity until results,treatment known and completed. Safe sex with all future sexual activity. We have sent testing for sexually transmitted infections, BV, yeast. We will notify you of any positive results once they are received. If required, we will prescribe any medications you might need.   Go to ER if you develop fever,nausea, abdominal pain or fever over 101.     ED Prescriptions     Medication Sig Dispense Auth. Provider  cefUROXime (CEFTIN) 500 MG tablet Take 1 tablet (500 mg total) by mouth 2 (two) times daily with a meal for 7 days. 14 tablet Clements Toro, Para March, NP      PDMP not reviewed this encounter.   Clancy Gourd, NP 04/12/23 [phone]

## 2023-04-13 ENCOUNTER — Telehealth: Payer: Self-pay | Admitting: Physician Assistant

## 2023-04-13 MED ORDER — FLUCONAZOLE 150 MG PO TABS: 150.0000 mg | ORAL_TABLET | ORAL | 0 refills | Status: DC

## 2023-04-13 NOTE — Telephone Encounter (Signed)
 Patient seen in this dept yesterday for UTI and vaginal itching. She has history of yeast infections and requested diflucan. Sent to pharmacy. Pending vaginal swab results

## 2023-04-15 LAB — CERVICOVAGINAL ANCILLARY ONLY
Bacterial Vaginitis (gardnerella): POSITIVE — AB
Candida Glabrata: NEGATIVE
Candida Vaginitis: POSITIVE — AB
Chlamydia: NEGATIVE
Comment: NEGATIVE
Comment: NEGATIVE
Comment: NEGATIVE
Comment: NEGATIVE
Comment: NEGATIVE
Comment: NORMAL
Neisseria Gonorrhea: NEGATIVE
Trichomonas: NEGATIVE

## 2023-04-16 ENCOUNTER — Telehealth: Payer: Self-pay | Admitting: Emergency Medicine

## 2023-04-16 MED ORDER — FLUCONAZOLE 150 MG PO TABS: 150.0000 mg | ORAL_TABLET | ORAL | 0 refills | Status: DC

## 2023-04-16 MED ORDER — METRONIDAZOLE 500 MG PO TABS: 500.0000 mg | ORAL_TABLET | Freq: Two times a day (BID) | ORAL | 0 refills | Status: DC

## 2023-04-16 MED ORDER — FLUCONAZOLE 150 MG PO TABS: 150.0000 mg | ORAL_TABLET | ORAL | 0 refills | Status: AC

## 2023-04-16 NOTE — Telephone Encounter (Signed)
 Patient tested positive for both bacterial vaginosis and vaginal yeast infection.  Diflucan and metronidazole sent to the pharmacy.

## 2023-04-30 ENCOUNTER — Other Ambulatory Visit: Payer: Self-pay | Admitting: Physician Assistant

## 2023-04-30 DIAGNOSIS — J4520 Mild intermittent asthma, uncomplicated: Secondary | ICD-10-CM

## 2023-05-01 MED ORDER — ALBUTEROL SULFATE HFA 108 (90 BASE) MCG/ACT IN AERS: 2.0000 | INHALATION_SPRAY | Freq: Four times a day (QID) | RESPIRATORY_TRACT | 0 refills | Status: DC | PRN

## 2023-05-01 NOTE — Telephone Encounter (Signed)
 OV 09/10/22 Requested Prescriptions  Pending Prescriptions Disp Refills   albuterol (VENTOLIN HFA) 108 (90 Base) MCG/ACT inhaler 8 g 2    Sig: Inhale 2 puffs into the lungs every 6 (six) hours as needed for wheezing or shortness of breath.     Pulmonology:  Beta Agonists 2 Failed - 05/01/2023  2:55 PM      Failed - Valid encounter within last 12 months    Recent Outpatient Visits   None            Passed - Last BP in normal range    BP Readings from Last 1 Encounters:  04/12/23 136/81         Passed - Last Heart Rate in normal range    Pulse Readings from Last 1 Encounters:  04/12/23 80

## 2023-06-09 ENCOUNTER — Other Ambulatory Visit: Payer: Self-pay | Admitting: Physician Assistant

## 2023-06-09 DIAGNOSIS — J4520 Mild intermittent asthma, uncomplicated: Secondary | ICD-10-CM

## 2023-06-10 ENCOUNTER — Ambulatory Visit
Admission: RE | Admit: 2023-06-10 | Discharge: 2023-06-10 | Disposition: A | Discharge status: Home / Self Care | Source: Ambulatory Visit | Attending: Emergency Medicine | Admitting: Emergency Medicine

## 2023-06-10 VITALS — BP 117/81 | HR 81 | Temp 98.9°F | Resp 16

## 2023-06-10 DIAGNOSIS — N898 Other specified noninflammatory disorders of vagina: Secondary | ICD-10-CM | POA: Diagnosis not present

## 2023-06-10 DIAGNOSIS — R319 Hematuria, unspecified: Secondary | ICD-10-CM | POA: Diagnosis not present

## 2023-06-10 DIAGNOSIS — B3741 Candidal cystitis and urethritis: Secondary | ICD-10-CM | POA: Insufficient documentation

## 2023-06-10 DIAGNOSIS — N390 Urinary tract infection, site not specified: Secondary | ICD-10-CM | POA: Insufficient documentation

## 2023-06-10 LAB — URINALYSIS, W/ REFLEX TO CULTURE (INFECTION SUSPECTED)
Bilirubin Urine: NEGATIVE
Glucose, UA: NEGATIVE mg/dL
Hgb urine dipstick: NEGATIVE
Ketones, ur: NEGATIVE mg/dL
Leukocytes,Ua: NEGATIVE
Nitrite: NEGATIVE
Protein, ur: NEGATIVE mg/dL
Specific Gravity, Urine: 1.025 (ref 1.005–1.030)
pH: 5.5 (ref 5.0–8.0)

## 2023-06-10 LAB — PREGNANCY, URINE: Preg Test, Ur: NEGATIVE

## 2023-06-10 MED ORDER — FLUCONAZOLE 150 MG PO TABS: ORAL_TABLET | ORAL | 0 refills | Status: DC

## 2023-06-10 MED ORDER — CEPHALEXIN 500 MG PO CAPS: 500.0000 mg | ORAL_CAPSULE | Freq: Four times a day (QID) | ORAL | 0 refills | Status: AC

## 2023-06-10 NOTE — ED Provider Notes (Signed)
 [facility]    CSN: [id number] Arrival date & time: 06/10/23  1600      History   Chief Complaint Chief Complaint  Patient presents with  . Vaginal Discharge    I'd like to be tested for BV yeast and a UTI as well just to make sure everything is clear - Entered by patient    HPI Cindy Matthews is a 28 y.o. female.   28 year old female, Cindy Matthews, presents to urgent care for evaluation of thick white vaginal discharge and smell for the last 2 to 3 days.  Patient states she would like to be tested for BV, yeast and UTI as well, to make sure everything is fine. No new partner, last had BV 1 month prior.   The history is provided by the patient. No language interpreter was used.    Past Medical History:  Diagnosis Date  . ADD (attention deficit disorder)    No meds since 2016  . Anxiety   . Asthma   . BMI 28.0-28.9,adult   . Eczema   . GERD (gastroesophageal reflux disease)   . Motion sickness    [address]    Patient Active Problem List   Diagnosis Date Noted  . Yeast cystitis 06/10/2023  . Vaginal discharge 04/12/2023  . BV (bacterial vaginosis) 12/18/2022  . Urinary tract infection with hematuria 12/18/2022  . Viral URI 12/18/2022  . Otalgia of both ears 12/18/2022  . Obesity (BMI 30.0-34.9) 05/23/2022  . Intermittent asthma without complication 05/23/2022  . Anxiety 01/30/2016  . History of recurrent UTIs 11/13/2014  . ADD (attention deficit disorder) 05/25/2014  . Dermatitis, eczematoid 05/25/2014  . Acid reflux 05/25/2014  . H/O herpes labialis 05/25/2014    Past Surgical History:  Procedure Laterality Date  . NO PAST SURGERIES    . NO PAST SURGERIES    . TONSILLECTOMY    . TONSILLECTOMY AND ADENOIDECTOMY N/A 03/21/2016   Procedure: TONSILLECTOMY AND ADENOIDECTOMY;  Surgeon: Rogers Clayman, MD;  Location: [facility];  Service: ENT;  Laterality: N/A;  Latex sensitivity  adenoids cauterized no tissue sent    OB History      Gravida  1   Para      Term      Preterm      AB  1   Living         SAB  1   IAB      Ectopic      Multiple      Live Births               Home Medications    Prior to Admission medications   Medication Sig Start Date End Date Taking? Authorizing Provider  albuterol  (VENTOLIN  HFA) 108 (90 Base) MCG/ACT inhaler Inhale 2 puffs into the lungs every 6 (six) hours as needed for wheezing or shortness of breath. 05/01/23  Yes Ostwalt, Janna, PA-C  cephALEXin  (KEFLEX ) 500 MG capsule Take 1 capsule (500 mg total) by mouth 4 (four) times daily for 7 days. 06/10/23 06/17/23 Yes Matalyn Nawaz, Eveleen Hinds, NP  fluconazole  (DIFLUCAN ) 150 MG tablet Take 1 tab p.o. today, repeat on day 3, day 7 06/10/23  Yes Marvion Bastidas, Eveleen Hinds, NP  atomoxetine (STRATTERA) 40 MG capsule Take 40 mg by mouth every morning. 10/10/22   [provider]  benzonatate  (TESSALON ) 100 MG capsule Take 1 capsule (100 mg total) by mouth every 8 (eight) hours. 10/04/22   Reena Canning, NP  busPIRone  (BUSPAR ) 7.5  MG tablet Take 1 tablet (7.5 mg total) by mouth 2 (two) times daily. 05/21/22   Ostwalt, Janna, PA-C  FLUoxetine (PROZAC) 20 MG capsule Take 20 mg by mouth daily. 09/26/22   [provider]  Iron , Ferrous Sulfate , 325 (65 Fe) MG TABS Take 325 mg by mouth daily. 325 mg ferrous sulfate  (65 mg elemental iron ) orally once daily, after a meal with orange juice Patient not taking: Reported on 09/10/2022 05/25/22   Ostwalt, Janna, PA-C  metroNIDAZOLE  (FLAGYL ) 500 MG tablet Take 1 tablet (500 mg total) by mouth 2 (two) times daily. 04/16/23   Kent Pear, NP  omeprazole  (PRILOSEC) 20 MG capsule Take 1 capsule (20 mg total) by mouth daily. 03/12/22   Ostwalt, Janna, PA-C  promethazine -dextromethorphan (PROMETHAZINE -DM) 6.25-15 MG/5ML syrup Take 5 mLs by mouth at bedtime as needed for cough. 10/04/22   Reena Canning, NP    Family History Family History  Problem Relation Age of Onset  . Asthma Sister   .  Anxiety disorder Sister   . Hypertension Maternal Grandmother   . Diabetes Maternal Grandfather   . Thyroid disease Paternal Grandmother     Social History Social History   Tobacco Use  . Smoking status: Former    Current packs/day: 0.00    Types: Cigarettes, E-cigarettes    Quit date: 01/15/2018    Years since quitting: 5.4  . Smokeless tobacco: Former  . Tobacco comments:    Quit 4-5 months ago   Vaping Use  . Vaping status: Every Day  . Start date: 01/16/2019  Substance Use Topics  . Alcohol use: No    Alcohol/week: 0.0 standard drinks of alcohol  . Drug use: Not Currently    Types: Marijuana     Allergies   Cat dander, Dog fennel allergy skin test, Dust mite extract, Nitrofurantoin, Sulfa  antibiotics, and Latex   Review of Systems Review of Systems  Constitutional:  Negative for fever.  Gastrointestinal:  Negative for abdominal pain.  Genitourinary:  Positive for vaginal discharge. Negative for dysuria and pelvic pain.       Malodorous discharge  All other systems reviewed and are negative.    Physical Exam Triage Vital Signs ED Triage Vitals [06/10/23 1619]  Encounter Vitals Group     BP 117/81     Systolic BP Percentile      Diastolic BP Percentile      Pulse Rate 81     Resp 16     Temp 98.9 F (37.2 C)     Temp Source Oral     SpO2 99 %     Weight      Height      Head Circumference      Peak Flow      Pain Score      Pain Loc      Pain Education      Exclude from Growth Chart    No data found.  Updated Vital Signs BP 117/81 (BP Location: Right Arm)   Pulse 81   Temp 98.9 F (37.2 C) (Oral)   Resp 16   LMP 05/22/2023 (Exact Date)   SpO2 99%   Visual Acuity Right Eye Distance:   Left Eye Distance:   Bilateral Distance:    Right Eye Near:   Left Eye Near:    Bilateral Near:     Physical Exam Vitals and nursing note reviewed.  Constitutional:      General: She is not in acute distress.  Appearance: She is well-developed and  well-groomed.  HENT:     Head: Normocephalic and atraumatic.  Eyes:     Conjunctiva/sclera: Conjunctivae normal.  Cardiovascular:     Rate and Rhythm: Normal rate and regular rhythm.     Heart sounds: No murmur heard. Pulmonary:     Effort: Pulmonary effort is normal. No respiratory distress.     Breath sounds: Normal breath sounds.  Abdominal:     Palpations: Abdomen is soft.     Tenderness: There is no abdominal tenderness.  Genitourinary:    Comments: Self swabbed, exam deferred  Musculoskeletal:        General: No swelling.     Cervical back: Normal range of motion and neck supple.  Skin:    General: Skin is warm and dry.     Capillary Refill: Capillary refill takes less than 2 seconds.  Neurological:     General: No focal deficit present.     Mental Status: She is alert and oriented to person, place, and time.     GCS: GCS eye subscore is 4. GCS verbal subscore is 5. GCS motor subscore is 6.  Psychiatric:        Attention and Perception: Attention normal.        Mood and Affect: Mood normal.        Speech: Speech normal.        Behavior: Behavior normal. Behavior is cooperative.      UC Treatments / Results  Labs (all labs ordered are listed, but only abnormal results are displayed) Labs Reviewed  URINALYSIS, W/ REFLEX TO CULTURE (INFECTION SUSPECTED) - Abnormal; Notable for the following components:      Result Value   Bacteria, UA FEW (*)    All other components within normal limits  URINE CULTURE  PREGNANCY, URINE  CERVICOVAGINAL ANCILLARY ONLY    EKG   Radiology No results found.  Procedures Procedures (including critical care time)  Medications Ordered in UC Medications - No data to display  Initial Impression / Assessment and Plan / UC Course  I have reviewed the triage vital signs and the nursing notes.  Pertinent labs & imaging results that were available during my care of the patient were reviewed by me and considered in my medical decision  making (see chart for details).  Clinical Course as of 06/13/23 1309  Mon Jun 10, 2023  1637 Pregnancy Test negative [JD]  1652 UA shows 11-20 WBC, no squamous, few RBC, + yeast budding will treat for uti/yeast.  Discussed exam findings and plan of care with patient, pt will check my chart for results, further treatment as indicated patient will be notified and prescription will be sent in. [JD]  Thu Jun 13, 2023  1308 Scripted flagyl  for bv, pt notified by staff. [JD]    Clinical Course User Index [JD] Saragrace Selke, Eveleen Hinds, NP   Discussed exam findings and plan of care with patient, strict go to ER precautions given.   Patient verbalized understanding to this provider.  Ddx: UTI, yeast cystitis , vaginal discharge, STI Final Clinical Impressions(s) / UC Diagnoses   Final diagnoses:  Vaginal discharge  Yeast cystitis  Urinary tract infection with hematuria, site unspecified     Discharge Instructions      We are treating you for UTI as well as yeast infection, take meds as directed, check MyChart for results.You urine is positive for UTI. Take antibiotic as directed. Drink plenty of water, follow up with PCP.   Go  to Er for new or worsening issues or concerns(fever, nausea, vomiting, unable to keep meds down, muscle aches, etc)  Check my chart for results. Avoid sexual activity until results,treatment known and completed. Safe sex with all future sexual activity. We have sent testing for sexually transmitted infections. We will notify you of any positive results once they are received. If required, we will prescribe any medications you might need.  Follow up with PCP. Return as needed.   ED Prescriptions     Medication Sig Dispense Auth. Provider   fluconazole  (DIFLUCAN ) 150 MG tablet Take 1 tab p.o. today, repeat on day 3, day 7 3 tablet Tanita Palinkas, NP   cephALEXin  (KEFLEX ) 500 MG capsule Take 1 capsule (500 mg total) by mouth 4 (four) times daily for 7 days. 28 capsule  Keston Seever, NP      PDMP not reviewed this encounter.   Peter Brands, NP 06/10/23 1744

## 2023-06-10 NOTE — ED Triage Notes (Signed)
 Patient noticed thick white discharge with smell x 2-3 days

## 2023-06-10 NOTE — Discharge Instructions (Addendum)
 We are treating you for UTI as well as yeast infection, take meds as directed, check MyChart for results.You urine is positive for UTI. Take antibiotic as directed. Drink plenty of water, follow up with PCP.   Go to Er for new or worsening issues or concerns(fever, nausea, vomiting, unable to keep meds down, muscle aches, etc)  Check my chart for results. Avoid sexual activity until results,treatment known and completed. Safe sex with all future sexual activity. We have sent testing for sexually transmitted infections. We will notify you of any positive results once they are received. If required, we will prescribe any medications you might need.  Follow up with PCP. Return as needed.

## 2023-06-11 LAB — CERVICOVAGINAL ANCILLARY ONLY
Bacterial Vaginitis (gardnerella): POSITIVE — AB
Candida Glabrata: NEGATIVE
Candida Vaginitis: NEGATIVE
Chlamydia: NEGATIVE
Comment: NEGATIVE
Comment: NEGATIVE
Comment: NEGATIVE
Comment: NEGATIVE
Comment: NEGATIVE
Comment: NORMAL
Neisseria Gonorrhea: NEGATIVE
Trichomonas: NEGATIVE

## 2023-06-11 LAB — URINE CULTURE: Culture: <10000 — AB

## 2023-06-13 ENCOUNTER — Ambulatory Visit (HOSPITAL_COMMUNITY): Payer: Self-pay

## 2023-06-13 ENCOUNTER — Telehealth: Payer: Self-pay | Admitting: Emergency Medicine

## 2023-06-13 MED ORDER — METRONIDAZOLE 500 MG PO TABS: 500.0000 mg | ORAL_TABLET | Freq: Two times a day (BID) | ORAL | 0 refills | Status: DC

## 2023-06-13 NOTE — Telephone Encounter (Signed)
 Scripted flagyl  for bv, staff notified pt

## 2023-06-28 ENCOUNTER — Ambulatory Visit
Admission: RE | Admit: 2023-06-28 | Discharge: 2023-06-28 | Disposition: A | Discharge status: Home / Self Care | Source: Ambulatory Visit | Attending: Emergency Medicine | Admitting: Emergency Medicine

## 2023-06-28 VITALS — BP 113/72 | HR 75 | Temp 98.4°F | Resp 16 | Ht 67.5 in | Wt 214.9 lb

## 2023-06-28 DIAGNOSIS — H6690 Otitis media, unspecified, unspecified ear: Secondary | ICD-10-CM | POA: Diagnosis not present

## 2023-06-28 MED ORDER — AZITHROMYCIN 250 MG PO TABS: 250.0000 mg | ORAL_TABLET | Freq: Every day | ORAL | 0 refills | Status: DC

## 2023-06-28 NOTE — Discharge Instructions (Addendum)
 Take the Azithromycin daily for 5 days with food for treatment of your ear infection.  Take an over-the-counter probiotic 1 hour after each dose of antibiotic to prevent diarrhea.  Use over-the-counter Tylenol and ibuprofen as needed for pain or fever.  Place a hot water bottle, or heating pad, underneath your pillowcase at night to help dilate up your ear and aid in pain relief as well as resolution of the infection.  Return for reevaluation for any new or worsening symptoms.

## 2023-06-28 NOTE — ED Provider Notes (Signed)
 [facility]    CSN: [id number] Arrival date & time: 06/28/23  1302      History   Chief Complaint Chief Complaint  Patient presents with   Otalgia    right   Appointment    HPI Cindy Matthews is a 28 y.o. female.   HPI  28 year old female with past medical history significant for asthma, anxiety, eczema, GERD, and ADD presents for evaluation of 6 days with right ear pain.  She reports that her pain began after she went swimming 6 days ago.  She went swimming again yesterday and the pain returned.  It has been waxing and waning for the last 6 days and typically will get up to a 3-4/10.  She did have drainage from the ear 1 day and she has had intermittent ringing, but the ringing predates the pain.  No changes to her hearing or fever.  Past Medical History:  Diagnosis Date   ADD (attention deficit disorder)    No meds since 2016   Anxiety    Asthma    BMI 28.0-28.9,adult    Eczema    GERD (gastroesophageal reflux disease)    Motion sickness    [address]    Patient Active Problem List   Diagnosis Date Noted   Yeast cystitis 06/10/2023   Vaginal discharge 04/12/2023   BV (bacterial vaginosis) 12/18/2022   Urinary tract infection with hematuria 12/18/2022   Viral URI 12/18/2022   Otalgia of both ears 12/18/2022   Obesity (BMI 30.0-34.9) 05/23/2022   Intermittent asthma without complication 05/23/2022   Anxiety 01/30/2016   History of recurrent UTIs 11/13/2014   ADD (attention deficit disorder) 05/25/2014   Dermatitis, eczematoid 05/25/2014   Acid reflux 05/25/2014   H/O herpes labialis 05/25/2014    Past Surgical History:  Procedure Laterality Date   NO PAST SURGERIES     NO PAST SURGERIES     TONSILLECTOMY     TONSILLECTOMY AND ADENOIDECTOMY N/A 03/21/2016   Procedure: TONSILLECTOMY AND ADENOIDECTOMY;  Surgeon: Rogers Clayman, MD;  Location: [facility];  Service: ENT;  Laterality: N/A;  Latex sensitivity  adenoids cauterized no  tissue sent    OB History     Gravida  1   Para      Term      Preterm      AB  1   Living         SAB  1   IAB      Ectopic      Multiple      Live Births               Home Medications    Prior to Admission medications   Medication Sig Start Date End Date Taking? Authorizing Provider  azithromycin  (ZITHROMAX  Z-PAK) 250 MG tablet Take 1 tablet (250 mg total) by mouth daily. Take 2 tablets on the first day and then 1 tablet daily thereafter for a total of 5 days of treatment. 06/28/23  Yes Kent Pear, NP  albuterol  (VENTOLIN  HFA) 108 (90 Base) MCG/ACT inhaler TAKE 2 PUFFS BY MOUTH EVERY 6 HOURS AS NEEDED FOR WHEEZE OR SHORTNESS OF BREATH 06/11/23   Ostwalt, Janna, PA-C  atomoxetine (STRATTERA) 40 MG capsule Take 40 mg by mouth every morning. 10/10/22   [provider]  Iron , Ferrous Sulfate , 325 (65 Fe) MG TABS Take 325 mg by mouth daily. 325 mg ferrous sulfate  (65 mg elemental iron ) orally once daily, after a meal with  orange juice Patient not taking: Reported on 09/10/2022 05/25/22   Ostwalt, Janna, PA-C  promethazine -dextromethorphan (PROMETHAZINE -DM) 6.25-15 MG/5ML syrup Take 5 mLs by mouth at bedtime as needed for cough. 10/04/22   Reena Canning, NP    Family History Family History  Problem Relation Age of Onset   Asthma Sister    Anxiety disorder Sister    Hypertension Maternal Grandmother    Diabetes Maternal Grandfather    Thyroid disease Paternal Grandmother     Social History Social History   Tobacco Use   Smoking status: Every Day    Current packs/day: 0.00    Types: Cigarettes, E-cigarettes    Last attempt to quit: 01/15/2018    Years since quitting: 5.4   Smokeless tobacco: Former   Tobacco comments:    Quit 4-5 months ago   Vaping Use   Vaping status: Every Day   Start date: 01/16/2019  Substance Use Topics   Alcohol use: No    Alcohol/week: 0.0 standard drinks of alcohol   Drug use: Not Currently    Types: Marijuana      Allergies   Cat dander, Dog fennel allergy skin test, Dust mite extract, Nitrofurantoin, Sulfa  antibiotics, and Latex   Review of Systems Review of Systems  Constitutional:  Negative for fever.  HENT:  Positive for ear discharge, ear pain and tinnitus. Negative for hearing loss.      Physical Exam Triage Vital Signs ED Triage Vitals  Encounter Vitals Group     BP      Girls Systolic BP Percentile      Girls Diastolic BP Percentile      Boys Systolic BP Percentile      Boys Diastolic BP Percentile      Pulse      Resp      Temp      Temp src      SpO2      Weight      Height      Head Circumference      Peak Flow      Pain Score      Pain Loc      Pain Education      Exclude from Growth Chart    No data found.  Updated Vital Signs BP 113/72 (BP Location: Right Arm)   Pulse 75   Temp 98.4 F (36.9 C) (Oral)   Resp 16   Ht 5' 7.5 (1.715 m)   Wt 214 lb 15.2 oz (97.5 kg)   LMP 06/18/2023 (Exact Date)   SpO2 97%   BMI 33.17 kg/m   Visual Acuity Right Eye Distance:   Left Eye Distance:   Bilateral Distance:    Right Eye Near:   Left Eye Near:    Bilateral Near:     Physical Exam Vitals and nursing note reviewed.  Constitutional:      Appearance: Normal appearance. She is not ill-appearing.  HENT:     Head: Normocephalic and atraumatic.     Right Ear: Ear canal and external ear normal. There is no impacted cerumen.     Left Ear: Tympanic membrane, ear canal and external ear normal. There is no impacted cerumen.     Ears:     Comments: Both external auditory canals are clear.  Right tympanic membrane is mildly erythematous.  Left is pearly gray in appearance.  No effusion noted bilaterally.  Skin:    General: Skin is warm and dry.     Capillary Refill:  Capillary refill takes less than 2 seconds.     Findings: No rash.   Neurological:     General: No focal deficit present.     Mental Status: She is alert and oriented to person, place, and  time.      UC Treatments / Results  Labs (all labs ordered are listed, but only abnormal results are displayed) Labs Reviewed - No data to display  EKG   Radiology No results found.  Procedures Procedures (including critical care time)  Medications Ordered in UC Medications - No data to display  Initial Impression / Assessment and Plan / UC Course  I have reviewed the triage vital signs and the nursing notes.  Pertinent labs & imaging results that were available during my care of the patient were reviewed by me and considered in my medical decision making (see chart for details).   Patient is a nontoxic-appearing 28 year old female presenting for evaluation of 6 days with a waxing waning right ear pain as outlined in HPI above.  The pain began after she went swimming in her friend's pool.  On exam the patient does have erythema to the right tympanic membrane but her left tympanic membrane is pearly gray in appearance.  Both EACs are clear.  There is no pain with movement of the auricle of the ear.  She also has no tenderness of external palpation of the eustachian tubes bilaterally.  Given the mild erythema that is forming patient may be developing an otitis media.  Since her symptoms have progressed and continued for 6 days I feel a trial of antibiotics is warranted.  I will discharge her home on azithromycin  once daily for 5 days for treatment of otitis media.   Final Clinical Impressions(s) / UC Diagnoses   Final diagnoses:  Acute otitis media, unspecified otitis media type     Discharge Instructions      Take the Azithromycin  daily for 5 days with food for treatment of your ear infection.  Take an over-the-counter probiotic 1 hour after each dose of antibiotic to prevent diarrhea.  Use over-the-counter Tylenol  and ibuprofen  as needed for pain or fever.  Place a hot water bottle, or heating pad, underneath your pillowcase at night to help dilate up your ear and aid in  pain relief as well as resolution of the infection.  Return for reevaluation for any new or worsening symptoms.      ED Prescriptions     Medication Sig Dispense Auth. Provider   azithromycin  (ZITHROMAX  Z-PAK) 250 MG tablet Take 1 tablet (250 mg total) by mouth daily. Take 2 tablets on the first day and then 1 tablet daily thereafter for a total of 5 days of treatment. 6 tablet Kent Pear, NP      PDMP not reviewed this encounter.   Kent Pear, NP 06/28/23 1347

## 2023-06-28 NOTE — ED Triage Notes (Signed)
 Pt c/o right ear pain. Started about 6 days ago. She states she went swimming and it started afterwards. She has h/o ear infections and swimmers ear. She state if she has to have an antibiotic she would like a ear drop instead of oral.

## 2023-07-03 ENCOUNTER — Encounter: Payer: Self-pay | Admitting: Physician Assistant

## 2023-07-05 NOTE — Telephone Encounter (Signed)
 Can someone reach out to offer this pt an appt

## 2023-07-17 ENCOUNTER — Ambulatory Visit: Admitting: Physician Assistant

## 2023-07-17 ENCOUNTER — Telehealth: Admitting: Physician Assistant

## 2023-07-17 DIAGNOSIS — J3089 Other allergic rhinitis: Secondary | ICD-10-CM

## 2023-07-17 DIAGNOSIS — K219 Gastro-esophageal reflux disease without esophagitis: Secondary | ICD-10-CM | POA: Diagnosis not present

## 2023-07-17 MED ORDER — CETIRIZINE HCL 10 MG PO TABS
10.0000 mg | ORAL_TABLET | Freq: Every day | ORAL | 2 refills | Status: AC
Start: 2023-07-17 — End: ?

## 2023-07-17 MED ORDER — OMEPRAZOLE 20 MG PO CPDR
20.0000 mg | DELAYED_RELEASE_CAPSULE | Freq: Two times a day (BID) | ORAL | 3 refills | Status: AC
Start: 2023-07-17 — End: ?

## 2023-07-17 NOTE — Progress Notes (Deleted)
 Established patient visit  [name]   DOB: Jan 25, 1995   28 y.o. Female  MRN: [id number] Visit Date: 07/17/2023  Today's healthcare provider: Jolynn Spencer, PA-C   No chief complaint on file.  Subjective       Discussed the use of AI scribe software for clinical note transcription with the patient, who gave verbal consent to proceed.  History of Present Illness        09/10/2022    2:22 PM 06/19/2022    2:57 PM 05/21/2022    3:15 PM  Depression screen PHQ 2/9  Decreased Interest 2 0 0  Down, Depressed, Hopeless 2 0 0  PHQ - 2 Score 4 0 0  Altered sleeping 2 0 0  Tired, decreased energy 2 1 2   Change in appetite 2 0 1  Feeling bad or failure about yourself  2 0 0  Trouble concentrating 2 1 3   Moving slowly or fidgety/restless 2 0 0  Suicidal thoughts 0 0 0  PHQ-9 Score 16 2 6   Difficult doing work/chores Somewhat difficult Not difficult at all Not difficult at all      09/10/2022    2:22 PM 05/21/2022    3:16 PM 03/12/2022    2:21 PM  GAD 7 : Generalized Anxiety Score  Nervous, Anxious, on Edge 2 2 1   Control/stop worrying 2 3 2   Worry too much - different things 2 2 3   Trouble relaxing 2 0 2  Restless 2 0 2  Easily annoyed or irritable 2 1 3   Afraid - awful might happen 2 0 3  Total GAD 7 Score 14 8 16   Anxiety Difficulty Somewhat difficult Not difficult at all Very difficult    Medications: Outpatient Medications Prior to Visit  Medication Sig  . albuterol  (VENTOLIN  HFA) 108 (90 Base) MCG/ACT inhaler TAKE 2 PUFFS BY MOUTH EVERY 6 HOURS AS NEEDED FOR WHEEZE OR SHORTNESS OF BREATH  . atomoxetine (STRATTERA) 40 MG capsule Take 40 mg by mouth every morning.  . azithromycin  (ZITHROMAX  Z-PAK) 250 MG tablet Take 1 tablet (250 mg total) by mouth daily. Take 2 tablets on the first day and then 1 tablet daily thereafter for a total of 5 days of treatment.  . Iron , Ferrous Sulfate , 325 (65 Fe) MG TABS Take 325 mg by mouth daily. 325 mg ferrous sulfate  (65 mg  elemental iron ) orally once daily, after a meal with orange juice (Patient not taking: Reported on 09/10/2022)  . promethazine -dextromethorphan (PROMETHAZINE -DM) 6.25-15 MG/5ML syrup Take 5 mLs by mouth at bedtime as needed for cough.   No facility-administered medications prior to visit.    Review of Systems  All other systems reviewed and are negative.  All negative Except see HPI   {Insert previous labs (optional):23779} {See past labs  Heme  Chem  Endocrine  Serology  Results Review (optional):1}   Objective    LMP 06/18/2023 (Exact Date)  {Insert last BP/Wt (optional):23777}{See vitals history (optional):1}   Physical Exam Vitals reviewed.  Constitutional:      General: She is not in acute distress.    Appearance: Normal appearance. She is well-developed. She is not diaphoretic.  HENT:     Head: Normocephalic and atraumatic.  Eyes:     General: No scleral icterus.    Conjunctiva/sclera: Conjunctivae normal.  Neck:     Thyroid: No thyromegaly.  Cardiovascular:     Rate and Rhythm: Normal rate and regular rhythm.     Pulses: Normal pulses.  Heart sounds: Normal heart sounds. No murmur heard. Pulmonary:     Effort: Pulmonary effort is normal. No respiratory distress.     Breath sounds: Normal breath sounds. No wheezing, rhonchi or rales.  Musculoskeletal:     Cervical back: Neck supple.     Right lower leg: No edema.     Left lower leg: No edema.  Lymphadenopathy:     Cervical: No cervical adenopathy.  Skin:    General: Skin is warm and dry.     Findings: No rash.  Neurological:     Mental Status: She is alert and oriented to person, place, and time. Mental status is at baseline.  Psychiatric:        Mood and Affect: Mood normal.        Behavior: Behavior normal.     No results found for any visits on 07/17/23.      Assessment and Plan Assessment & Plan     No orders of the defined types were placed in this encounter.   No follow-ups on  file.   The patient was advised to call back or seek an in-person evaluation if the symptoms worsen or if the condition fails to improve as anticipated.  I discussed the assessment and treatment plan with the patient. The patient was provided an opportunity to ask questions and all were answered. The patient agreed with the plan and demonstrated an understanding of the instructions.  I, Lavanya Roa, PA-C have reviewed all documentation for this visit. The documentation on 07/17/2023  for the exam, diagnosis, procedures, and orders are all accurate and complete.  Jolynn Spencer, [facility], MMS [facility] [phone] (phone) [phone] (fax)  [facility]

## 2023-07-17 NOTE — Progress Notes (Signed)
 MyChart Video Visit  Virtual Visit via Video Note   This format is felt to be most appropriate for this patient at this time. Physical exam was limited by quality of the video and audio technology used for the visit.   Provider location: Virtual Visit Location Provider: Office/Clinic Patient Location: Home  I discussed the limitations of evaluation and management by telemedicine and the availability of in person appointments. The patient expressed understanding and agreed to proceed.  [name]   DOB: 02-09-1995   28 y.o. Female  MRN: [id number] Visit Date: 07/17/2023  Today's healthcare provider: Jolynn Spencer, PA-C   Chief Complaint  Patient presents with   Allergies    Allergies   Subjective    HPI  Discussed the use of AI scribe software for clinical note transcription with the patient, who gave verbal consent to proceed.  History of Present Illness Cindy Matthews is a 28 year old female who presents with worsening allergy symptoms.  She experiences puffy eyes, frequent sneezing, nasal congestion, and postnasal drainage, which have not improved with over-the-counter first-generation antihistamines like Equate. She has a morning cough upon waking but no wheezing or persistent shortness of breath. Occasional breathlessness occurs after coughing. She also experiences heartburn, which she attributes to inconsistent use of her medication.     Medications: Outpatient Medications Prior to Visit  Medication Sig   albuterol  (VENTOLIN  HFA) 108 (90 Base) MCG/ACT inhaler TAKE 2 PUFFS BY MOUTH EVERY 6 HOURS AS NEEDED FOR WHEEZE OR SHORTNESS OF BREATH   atomoxetine (STRATTERA) 40 MG capsule Take 40 mg by mouth every morning.   azithromycin  (ZITHROMAX  Z-PAK) 250 MG tablet Take 1 tablet (250 mg total) by mouth daily. Take 2 tablets on the first day and then 1 tablet daily thereafter for a total of 5 days of treatment.   Iron , Ferrous Sulfate , 325 (65 Fe) MG TABS Take 325 mg  by mouth daily. 325 mg ferrous sulfate  (65 mg elemental iron ) orally once daily, after a meal with orange juice (Patient not taking: Reported on 09/10/2022)   promethazine -dextromethorphan (PROMETHAZINE -DM) 6.25-15 MG/5ML syrup Take 5 mLs by mouth at bedtime as needed for cough.   No facility-administered medications prior to visit.    Review of Systems All negative  Except see HPI       Objective    LMP 06/18/2023 (Exact Date)        Physical Exam  Constitutional:      General: She is not in acute distress.    Appearance: Normal appearance.  HENT:     Head: Normocephalic.  Pulmonary:     Effort: Pulmonary effort is normal. No respiratory distress.  Neurological:     Mental Status: She is alert and oriented to person, place, and time. Mental status is at baseline.      Assessment and Plan Assessment & Plan Allergic Rhinitis Chronic allergic rhinitis with exacerbation. Symptoms worsened despite first-generation antihistamine. Recommended second-generation antihistamine for efficacy and non-drowsy effect. Advised nasal saline spray and Flonase for symptom management. - Prescribe second-generation antihistamine daily. - Recommend nasal saline spray or gel twice daily. - Advise Flonase nasal spray twice daily post-nasal cleansing. - Instruct to return if symptoms persist or worsen, especially with shortness of breath or increased cough. Pt denies having wheezing, sob, asthma symptoms Denies having viral infection? Drink plenty of water, keep air humidifier next to bed Advised cessation of vaping as it could contribute to current symptoms Will follow-up if symptoms worsen/persist  Gastroesophageal  Reflux Disease (GERD) Heartburn with recent medication non-compliance. Potential contribution to morning cough and postnasal drainage. Prescription medication considered stronger; over-the-counter omeprazole  suggested for financial considerations. - Prescribe GERD medication. -  Advise lifestyle modifications: no reclining post-meals.   No follow-ups on file.     I discussed the assessment and treatment plan with the patient. The patient was provided an opportunity to ask questions and all were answered. The patient agreed with the plan and demonstrated an understanding of the instructions.   The patient was advised to call back or seek an in-person evaluation if the symptoms worsen or if the condition fails to improve as anticipated.  I, Alexes Menchaca, PA-C have reviewed all documentation for this visit. The documentation on 07/17/2023  for the exam, diagnosis, procedures, and orders are all accurate and complete.  Jolynn Spencer, [facility], MMS [facility] [phone] (phone) [phone] (fax)  [facility]

## 2023-09-30 ENCOUNTER — Ambulatory Visit (INDEPENDENT_AMBULATORY_CARE_PROVIDER_SITE_OTHER): Admitting: Physician Assistant

## 2023-09-30 VITALS — BP 114/78 | HR 88 | Ht 67.5 in | Wt 200.0 lb

## 2023-09-30 DIAGNOSIS — E66811 Obesity, class 1: Secondary | ICD-10-CM

## 2023-09-30 DIAGNOSIS — F419 Anxiety disorder, unspecified: Secondary | ICD-10-CM | POA: Diagnosis not present

## 2023-09-30 DIAGNOSIS — D508 Other iron deficiency anemias: Secondary | ICD-10-CM

## 2023-09-30 DIAGNOSIS — R41840 Attention and concentration deficit: Secondary | ICD-10-CM

## 2023-09-30 DIAGNOSIS — Z0001 Encounter for general adult medical examination with abnormal findings: Secondary | ICD-10-CM | POA: Diagnosis not present

## 2023-09-30 DIAGNOSIS — E785 Hyperlipidemia, unspecified: Secondary | ICD-10-CM

## 2023-09-30 DIAGNOSIS — Z0000 Encounter for general adult medical examination without abnormal findings: Secondary | ICD-10-CM

## 2023-09-30 DIAGNOSIS — K5909 Other constipation: Secondary | ICD-10-CM

## 2023-09-30 DIAGNOSIS — F17290 Nicotine dependence, other tobacco product, uncomplicated: Secondary | ICD-10-CM | POA: Diagnosis not present

## 2023-09-30 DIAGNOSIS — J3089 Other allergic rhinitis: Secondary | ICD-10-CM

## 2023-09-30 DIAGNOSIS — F339 Major depressive disorder, recurrent, unspecified: Secondary | ICD-10-CM

## 2023-09-30 NOTE — Progress Notes (Signed)
 "    Complete physical exam  [name]   DOB: 1995/02/23   28 y.o. Female  MRN: [id number] Visit Date: 09/30/2023  Today's healthcare provider: Jolynn Spencer, PA-C   Chief Complaint  Patient presents with   Annual Exam   Subjective    Cindy Matthews is a 28 y.o. female who presents today for a complete physical exam.   Discussed the use of AI scribe software for clinical note transcription with the patient, who gave verbal consent to proceed.  History of Present Illness Cindy Matthews is a 28 year old female who presents with concerns about her mood and medication management. She is accompanied by her grandmother.  She experiences mood swings and is concerned about the impact on her daily life. She has not been on any medication for over two months and is seeking a new psychiatrist for medication management. She has a history of ADHD and mood disorders, previously managed with Strattera and Prozac, which were discontinued due to side effects and dissatisfaction with her previous psychiatrist. She is considering counseling.  Dizziness and fatigue are present, attributed to iron  deficiency anemia. Her hemoglobin levels are low, and she experiences heavy menstrual periods.  She has a history of high cholesterol, managed by dietary changes. Her triglycerides were elevated, and her HDL was decreased during her last visit.  She experiences occasional visual disturbances, such as words colliding when reading or coloring, noted since childhood. Testing for dyslexia was negative.  She has a history of constipation, with infrequent bowel movements and solid stools, managed by increasing fiber intake.  She has a history of vaping and smoking, currently trying to quit. She switched back to vaping after a brief period of smoking cigarettes due to a vape ban. She is concerned about respiratory issues and allergies, experiencing a cough and chest discomfort.  Her family history  includes cervical cancer in her great grandmother and thyroid  disease.    Last depression screening scores    09/10/2022    2:22 PM 06/19/2022    2:57 PM 05/21/2022    3:15 PM  PHQ 2/9 Scores  PHQ - 2 Score 4 0 0  PHQ- 9 Score 16 2 6    Last fall risk screening    09/10/2022    2:22 PM  Fall Risk   Falls in the past year? 0  Number falls in past yr: 0  Injury with Fall? 0  Risk for fall due to : No Fall Risks  Follow up Falls evaluation completed   Last Audit-C alcohol use screening    06/19/2022    2:58 PM  Alcohol Use Disorder Test (AUDIT)  1. How often do you have a drink containing alcohol? 1  2. How many drinks containing alcohol do you have on a typical day when you are drinking? 2  3. How often do you have six or more drinks on one occasion? 0  AUDIT-C Score 3   A score of 3 or more in women, and 4 or more in men indicates increased risk for alcohol abuse, EXCEPT if all of the points are from question 1   Past Medical History:  Diagnosis Date   ADD (attention deficit disorder)    No meds since 2016   Anxiety    Asthma    BMI 28.0-28.9,adult    Eczema    GERD (gastroesophageal reflux disease)    Motion sickness    [address]   Past Surgical History:  Procedure Laterality Date   NO PAST SURGERIES     NO PAST SURGERIES     TONSILLECTOMY     TONSILLECTOMY AND ADENOIDECTOMY N/A 03/21/2016   Procedure: TONSILLECTOMY AND ADENOIDECTOMY;  Surgeon: Carolee Hunter, MD;  Location: [facility];  Service: ENT;  Laterality: N/A;  Latex sensitivity  adenoids cauterized no tissue sent   Social History   Socioeconomic History   Marital status: Single    Spouse name: Not on file   Number of children: Not on file   Years of education: Not on file   Highest education level: Not on file  Occupational History   Not on file  Tobacco Use   Smoking status: Every Day    Current packs/day: 0.00    Types: Cigarettes, E-cigarettes    Last attempt to quit:  01/15/2018    Years since quitting: 5.7   Smokeless tobacco: Former   Tobacco comments:    Quit 4-5 months ago   Vaping Use   Vaping status: Every Day   Start date: 01/16/2019  Substance and Sexual Activity   Alcohol use: No    Alcohol/week: 0.0 standard drinks of alcohol   Drug use: Not Currently    Types: Marijuana   Sexual activity: Yes    Birth control/protection: None  Other Topics Concern   Not on file  Social History Narrative   Not on file   Social Drivers of Health   Financial Resource Strain: Not on file  Food Insecurity: Not on file  Transportation Needs: No Transportation Needs (03/19/2022)   PRAPARE - Administrator, Civil Service (Medical): No    Lack of Transportation (Non-Medical): No  Physical Activity: Not on file  Stress: Not on file  Social Connections: Unknown (05/30/2021)   Received from [facility]   Social Network    Social Network: Not on file  Intimate Partner Violence: Unknown (04/21/2021)   Received from [facility]   HITS    Physically Hurt: Not on file    Insult or Talk Down To: Not on file    Threaten Physical Harm: Not on file    Scream or Curse: Not on file   Family Status  Relation Name Status   Mother  Alive   Father  Alive   Sister  Alive   MGM  Alive   MGF  Alive   PGM  Alive   PGF  Alive  No partnership data on file   Family History  Problem Relation Age of Onset   Asthma Sister    Anxiety disorder Sister    Hypertension Maternal Grandmother    Diabetes Maternal Grandfather    Thyroid  disease Paternal Grandmother    Allergies  Allergen Reactions   Cat Dander Itching    Other reaction(s): Sneezing Watery eyes   Dog Fennel Allergy Skin Test    Dust Mite Extract    Nitrofurantoin Nausea Only   Sulfa  Antibiotics Other (See Comments)    Kidney pain and seasonal allergy symptoms   Latex Rash    Patient Care Team: Shonta Bourque, PA-C as PCP - General (Physician Assistant)   Medications: Outpatient  Medications Prior to Visit  Medication Sig   albuterol  (VENTOLIN  HFA) 108 (90 Base) MCG/ACT inhaler TAKE 2 PUFFS BY MOUTH EVERY 6 HOURS AS NEEDED FOR WHEEZE OR SHORTNESS OF BREATH   atomoxetine (STRATTERA) 40 MG capsule Take 40 mg by mouth every morning.   azithromycin  (ZITHROMAX  Z-PAK) 250 MG tablet Take 1 tablet (250  mg total) by mouth daily. Take 2 tablets on the first day and then 1 tablet daily thereafter for a total of 5 days of treatment.   cetirizine  (ZYRTEC ) 10 MG tablet Take 1 tablet (10 mg total) by mouth daily.   Iron , Ferrous Sulfate , 325 (65 Fe) MG TABS Take 325 mg by mouth daily. 325 mg ferrous sulfate  (65 mg elemental iron ) orally once daily, after a meal with orange juice (Patient not taking: Reported on 09/10/2022)   omeprazole  (PRILOSEC) 20 MG capsule Take 1 capsule (20 mg total) by mouth 2 (two) times daily before a meal.   promethazine -dextromethorphan (PROMETHAZINE -DM) 6.25-15 MG/5ML syrup Take 5 mLs by mouth at bedtime as needed for cough.   No facility-administered medications prior to visit.    Review of Systems  All other systems reviewed and are negative.  Except see HPI     Objective    BP 114/78 (BP Location: Right Arm, Patient Position: Sitting, Cuff Size: Normal)   Pulse 88   Ht 5' 7.5 (1.715 m)   Wt 200 lb (90.7 kg)   SpO2 98%   BMI 30.86 kg/m      Physical Exam Vitals reviewed.  Constitutional:      General: She is not in acute distress.    Appearance: Normal appearance. She is well-developed. She is not ill-appearing, toxic-appearing or diaphoretic.  HENT:     Head: Normocephalic and atraumatic.     Right Ear: Tympanic membrane, ear canal and external ear normal.     Left Ear: Tympanic membrane, ear canal and external ear normal.     Nose: Nose normal. No congestion or rhinorrhea.     Mouth/Throat:     Mouth: Mucous membranes are moist.     Pharynx: Oropharynx is clear. No oropharyngeal exudate.  Eyes:     General: No scleral icterus.        Right eye: No discharge.        Left eye: No discharge.     Conjunctiva/sclera: Conjunctivae normal.     Pupils: Pupils are equal, round, and reactive to light.  Neck:     Thyroid : No thyromegaly.     Vascular: No carotid bruit.  Cardiovascular:     Rate and Rhythm: Normal rate and regular rhythm.     Pulses: Normal pulses.     Heart sounds: Normal heart sounds. No murmur heard.    No friction rub. No gallop.  Pulmonary:     Effort: Pulmonary effort is normal. No respiratory distress.     Breath sounds: Normal breath sounds. No wheezing or rales.  Abdominal:     General: Abdomen is flat. Bowel sounds are normal. There is no distension.     Palpations: Abdomen is soft. There is no mass.     Tenderness: There is no abdominal tenderness. There is no right CVA tenderness, left CVA tenderness, guarding or rebound.     Hernia: No hernia is present.  Musculoskeletal:        General: No swelling, tenderness, deformity or signs of injury. Normal range of motion.     Cervical back: Normal range of motion and neck supple. No rigidity or tenderness.     Right lower leg: No edema.     Left lower leg: No edema.  Lymphadenopathy:     Cervical: No cervical adenopathy.  Skin:    General: Skin is warm and dry.     Coloration: Skin is not jaundiced or pale.     Findings: No  bruising, erythema, lesion or rash.  Neurological:     Mental Status: She is alert and oriented to person, place, and time. Mental status is at baseline.     Gait: Gait normal.  Psychiatric:        Mood and Affect: Mood normal.        Behavior: Behavior normal.        Thought Content: Thought content normal.        Judgment: Judgment normal.      No results found for any visits on 09/30/23.  Assessment & Plan    Routine Health Maintenance and Physical Exam  Exercise Activities and Dietary recommendations  Goals   None     Immunization History  Administered Date(s) Administered   DTaP 12/07/1997,  02/07/1998, 04/05/1998, 12/05/1998, 11/06/2001   HIB (PRP-OMP) 12/07/1997, 02/07/1998, 12/05/1998   HPV Quadrivalent 03/18/2013   Hepatitis A 05/26/2007, 12/20/2008   Hepatitis A, Ped/Adol-2 Dose 05/26/2007, 12/20/2008   Hepatitis B 04/05/1998, 06/07/1998, 03/23/1999   Hepatitis B, PED/ADOLESCENT 04/05/1998, 06/07/1998, 03/23/1999   Hpv-Unspecified 11/02/2010, 07/27/2011   IPV 12/07/1997, 02/07/1998, 09/08/1998, 11/06/2001   Influenza-Unspecified 11/02/2010   MMR 09/08/1998, 11/06/2001   Meningococcal Conjugate 07/27/2011   Pneumococcal Conjugate PCV 7 12/05/1998, 03/23/1999   Tdap 12/20/2008   Varicella 09/08/1998, 05/26/2007    Health Maintenance  Topic Date Due   Hepatitis C Screening  Never done   Pneumococcal Vaccine (1 of 2 - PCV) 09/03/2016   Pap Smear  Never done   DTaP/Tdap/Td vaccine (7 - Td or Tdap) 12/21/2018   Flu Shot  08/16/2023   COVID-19 Vaccine (1 - 2024-25 season) Never done   Hepatitis B Vaccine  Completed   HPV Vaccine  Completed   HIV Screening  Completed   Meningitis B Vaccine  Aged Out    Discussed health benefits of physical activity, and encouraged her to engage in regular exercise appropriate for her age and condition.  Assessment & Plan Annual physical exam Routine wellness visit with no acute complaints. - Discuss upcoming Pap smear with OB/GYN next month.  Things to do to keep yourself healthy  - Exercise at least 30-45 minutes a day, 3-4 days a week.  - Eat a low-fat diet with lots of fruits and vegetables, up to 7-9 servings per day.  - Seatbelts can save your life. Wear them always.  - Smoke detectors on every level of your home, check batteries every year.  - Eye Doctor - have an eye exam every 1-2 years  - Safe sex - if you may be exposed to STDs, use a condom.  - Alcohol -  If you drink, do it moderately, less than 2 drinks per day.  - Health Care Power of Attorney. Choose someone to speak for you if you are not able.  - Depression is  common in our stressful world.If you're feeling down or losing interest in things you normally enjoy, please come in for a visit.  - Violence - If anyone is threatening or hurting you, please call immediately.  Depression Chronic and unstable    09/10/2022    2:22 PM 06/19/2022    2:57 PM 05/21/2022    3:15 PM  PHQ9 SCORE ONLY  PHQ-9 Total Score 16  2  6       Data saved with a previous flowsheet row definition      09/10/2022    2:22 PM 05/21/2022    3:16 PM 03/12/2022    2:21 PM  GAD 7 : Generalized  Anxiety Score  Nervous, Anxious, on Edge 2 2 1   Control/stop worrying 2 3 2   Worry too much - different things 2 2 3   Trouble relaxing 2 0 2  Restless 2 0 2  Easily annoyed or irritable 2 1 3   Afraid - awful might happen 2 0 3  Total GAD 7 Score 14 8 16   Anxiety Difficulty Somewhat difficult Not difficult at all Very difficult   Discontinued Strattera and mood stabilizer due to dissatisfaction with previous psychiatrist. Reports mood instability and interpersonal conflict. No current medication. Interested in resuming treatment. Concerns about side effects such as excessive sweating with previous medications. Plans to change Medicaid to [employer] for better coverage.  - Schedule counseling in clinic. - Explore other psychiatrists who accept Medicaid. - Consider resuming fluoxetine (Prozac) if needed. Collaboration of Care: Medication Management AEB  , Primary Care Provider AEB  , Psychiatrist AEB  , and Referral or follow-up with counselor/therapist AEB    Patient/Guardian was advised Release of Information must be obtained prior to any record release in order to collaborate their care with an outside provider. Patient/Guardian was advised if they have not already done so to contact the registration department to sign all necessary forms in order for us  to release information regarding their care.   Consent: Patient/Guardian gives verbal consent for treatment and assignment of benefits  for services provided during this visit. Patient/Guardian expressed understanding and agreed to proceed.   Nicotine  dependence (vaping and cigarettes) Vaping and cigarette use with attempts to wean off vaping. Reports cough possibly related to vaping or allergies. Lives with two cats, which may contribute to respiratory symptoms.  Iron  deficiency anemia Confirmed iron  deficiency anemia with low hemoglobin. Suspected heavy menstrual bleeding as a potential cause. - Recheck iron  levels. - Follow up with OB/GYN for evaluation of heavy menstrual bleeding.  Constipation Variable bowel habits with episodes of constipation, sometimes going a week without bowel movement. Family history of constipation. - Increase dietary fiber intake.  Pt was advised: -Eliminate medications that cause constipation. -Increase fluid intake. -Increase soluble fiber (25-30g/day) in diet. -Encourage regular defecation attempts after eating. -Regular exercise -Enemas if other methods fail- Bulking agents (accompanied by adequate fluids)  Psyllium  (Konsyl, Metamucil, Perdiem Fiber): 1 tbsp (approx 3.5 grams fiber) in 8-oz liquid PO daily up to TID Polyethylene glycol (PEG) (MiraLAX) 17 g/day PO dissolved in 4 to 8 oz of beverage (current evidence shows PEG to be superior to lactulose Pt instructions/high-fiber eating plan were provided  advised to add prunes, yogurt to his daily diet.  Dyslipidemia (elevated triglycerides, low HDL) Previous labs showed elevated triglycerides and low HDL. Family history of high cholesterol. Dietary changes made to reduce butter and fried foods. Potential hereditary component discussed. - Repeat lipid panel. - Consider dietary counseling if needed.  Allergic rhinitis Taking allergy medication. Possible cough related to allergies or vaping. Lives with two cats, which may contribute to symptoms.  - Use nasal saline rinses before nose sprays such as with Neilmed Sinus Rinse bottle.   Use distilled water.   - Use Flonase 2 sprays each nostril daily. Aim upward and outward. - Use Zyrtec  10 mg daily.   Visual disturbance (transient blurring/colliding of words) Transient blurring and colliding of words when reading or coloring after 5-10 minutes. Most likely connected to migraine? - Schedule eye examination. Consider follow-up with neurology  Obesity (BMI 30.0-34.9) Chronic and stable Body mass index is 30.86 kg/m. ordered - Lipid Panel With LDL/HDL Ratio -  Comprehensive metabolic panel with GFR - CBC with Differential/Platelet - TSH Weight loss of 5% of pt's current weight via healthy diet and daily exercise encouraged. Will follow-up  Vaping nicotine  dependence, tobacco product Cessation advised Will follow-up  No follow-ups on file.    The patient was advised to call back or seek an in-person evaluation if the symptoms worsen or if the condition fails to improve as anticipated.  I discussed the assessment and treatment plan with the patient. The patient was provided an opportunity to ask questions and all were answered. The patient agreed with the plan and demonstrated an understanding of the instructions.  I, Ouita Nish, PA-C have reviewed all documentation for this visit. The documentation on 09/30/2023  for the exam, diagnosis, procedures, and orders are all accurate and complete.  Jolynn Spencer, [facility], MMS [facility] [phone] (phone) [phone] (fax)  [facility] "

## 2023-10-01 DIAGNOSIS — E66811 Obesity, class 1: Secondary | ICD-10-CM | POA: Diagnosis not present

## 2023-10-02 ENCOUNTER — Ambulatory Visit: Payer: Self-pay | Admitting: Physician Assistant

## 2023-10-02 ENCOUNTER — Encounter: Payer: Self-pay | Admitting: Physician Assistant

## 2023-10-02 DIAGNOSIS — E785 Hyperlipidemia, unspecified: Secondary | ICD-10-CM | POA: Insufficient documentation

## 2023-10-02 DIAGNOSIS — Z0000 Encounter for general adult medical examination without abnormal findings: Secondary | ICD-10-CM | POA: Insufficient documentation

## 2023-10-02 DIAGNOSIS — F339 Major depressive disorder, recurrent, unspecified: Secondary | ICD-10-CM | POA: Insufficient documentation

## 2023-10-02 DIAGNOSIS — F17290 Nicotine dependence, other tobacco product, uncomplicated: Secondary | ICD-10-CM | POA: Insufficient documentation

## 2023-10-02 DIAGNOSIS — K5909 Other constipation: Secondary | ICD-10-CM | POA: Insufficient documentation

## 2023-10-02 DIAGNOSIS — J309 Allergic rhinitis, unspecified: Secondary | ICD-10-CM | POA: Insufficient documentation

## 2023-10-02 DIAGNOSIS — D649 Anemia, unspecified: Secondary | ICD-10-CM | POA: Insufficient documentation

## 2023-10-02 LAB — COMPREHENSIVE METABOLIC PANEL WITH GFR
ALT: 18 IU/L (ref 0–32)
AST: 13 IU/L (ref 0–40)
Albumin: 4.5 g/dL (ref 4.0–5.0)
Alkaline Phosphatase: 98 IU/L (ref 41–116)
BUN/Creatinine Ratio: 13 (ref 9–23)
BUN: 8 mg/dL (ref 6–20)
Bilirubin Total: 0.5 mg/dL (ref 0.0–1.2)
CO2: 21 mmol/L (ref 20–29)
Calcium: 9.8 mg/dL (ref 8.7–10.2)
Chloride: 101 mmol/L (ref 96–106)
Creatinine, Ser: 0.6 mg/dL (ref 0.57–1.00)
Globulin, Total: 2.6 g/dL (ref 1.5–4.5)
Glucose: 92 mg/dL (ref 70–99)
Potassium: 4.7 mmol/L (ref 3.5–5.2)
Sodium: 137 mmol/L (ref 134–144)
Total Protein: 7.1 g/dL (ref 6.0–8.5)
eGFR: 127 mL/min/1.73 (ref >59)

## 2023-10-02 LAB — CBC WITH DIFFERENTIAL/PLATELET
Basophils Absolute: 0.1 x10E3/uL (ref 0.0–0.2)
Basos: 1 %
EOS (ABSOLUTE): 0.3 x10E3/uL (ref 0.0–0.4)
Eos: 3 %
Hematocrit: 38.5 % (ref 34.0–46.6)
Hemoglobin: 11.7 g/dL (ref 11.1–15.9)
Immature Grans (Abs): 0 x10E3/uL (ref 0.0–0.1)
Immature Granulocytes: 0 %
Lymphocytes Absolute: 2.5 x10E3/uL (ref 0.7–3.1)
Lymphs: 30 %
MCH: 24.9 pg — ABNORMAL LOW (ref 26.6–33.0)
MCHC: 30.4 g/dL — ABNORMAL LOW (ref 31.5–35.7)
MCV: 82 fL (ref 79–97)
Monocytes Absolute: 0.5 x10E3/uL (ref 0.1–0.9)
Monocytes: 6 %
Neutrophils Absolute: 5 x10E3/uL (ref 1.4–7.0)
Neutrophils: 60 %
Platelets: 307 x10E3/uL (ref 150–450)
RBC: 4.7 x10E6/uL (ref 3.77–5.28)
RDW: 15.7 % — ABNORMAL HIGH (ref 11.7–15.4)
WBC: 8.4 x10E3/uL (ref 3.4–10.8)

## 2023-10-02 LAB — LIPID PANEL WITH LDL/HDL RATIO
Cholesterol, Total: 180 mg/dL (ref 100–199)
HDL: 35 mg/dL — ABNORMAL LOW (ref >39)
LDL Chol Calc (NIH): 113 mg/dL — ABNORMAL HIGH (ref 0–99)
LDL/HDL Ratio: 3.2 ratio (ref 0.0–3.2)
Triglycerides: 183 mg/dL — ABNORMAL HIGH (ref 0–149)
VLDL Cholesterol Cal: 32 mg/dL (ref 5–40)

## 2023-10-02 LAB — TSH: TSH: 0.911 u[IU]/mL (ref 0.450–4.500)

## 2023-10-08 DIAGNOSIS — Z7251 High risk heterosexual behavior: Secondary | ICD-10-CM | POA: Diagnosis not present

## 2023-10-08 DIAGNOSIS — N760 Acute vaginitis: Secondary | ICD-10-CM | POA: Diagnosis not present

## 2023-10-28 ENCOUNTER — Ambulatory Visit: Payer: Self-pay | Admitting: Family

## 2023-10-28 ENCOUNTER — Encounter: Payer: Self-pay | Admitting: Family

## 2023-10-28 VITALS — BP 96/70 | HR 83 | Ht 67.5 in | Wt 201.2 lb

## 2023-10-28 DIAGNOSIS — F901 Attention-deficit hyperactivity disorder, predominantly hyperactive type: Secondary | ICD-10-CM | POA: Diagnosis not present

## 2023-10-28 DIAGNOSIS — Z0130 Encounter for examination of blood pressure without abnormal findings: Secondary | ICD-10-CM | POA: Diagnosis not present

## 2023-10-28 DIAGNOSIS — F419 Anxiety disorder, unspecified: Secondary | ICD-10-CM | POA: Diagnosis not present

## 2023-10-28 DIAGNOSIS — E611 Iron deficiency: Secondary | ICD-10-CM | POA: Diagnosis not present

## 2023-10-28 DIAGNOSIS — E785 Hyperlipidemia, unspecified: Secondary | ICD-10-CM

## 2023-10-28 DIAGNOSIS — R1084 Generalized abdominal pain: Secondary | ICD-10-CM

## 2023-10-28 DIAGNOSIS — R238 Other skin changes: Secondary | ICD-10-CM | POA: Diagnosis not present

## 2023-10-28 DIAGNOSIS — E538 Deficiency of other specified B group vitamins: Secondary | ICD-10-CM | POA: Diagnosis not present

## 2023-10-28 DIAGNOSIS — R42 Dizziness and giddiness: Secondary | ICD-10-CM | POA: Diagnosis not present

## 2023-10-28 DIAGNOSIS — R140 Abdominal distension (gaseous): Secondary | ICD-10-CM | POA: Diagnosis not present

## 2023-10-28 DIAGNOSIS — R5383 Other fatigue: Secondary | ICD-10-CM

## 2023-10-28 DIAGNOSIS — Z5181 Encounter for therapeutic drug level monitoring: Secondary | ICD-10-CM

## 2023-10-28 DIAGNOSIS — K5904 Chronic idiopathic constipation: Secondary | ICD-10-CM

## 2023-10-28 NOTE — Progress Notes (Signed)
 New Patient Office Visit  Subjective  Patient ID: Cindy Matthews, female    DOB: 1995-04-24  Age: 28 y.o. MRN: [id number]  CC:  Chief Complaint  Patient presents with   Establish Care    NPE    HPI Cindy Matthews presents to establish care Previous Primary Care provider/office: Jolynn Spencer, PA  she does have additional concerns to discuss today.   The patient presents with multiple concerns requiring comprehensive evaluation and management. They report a history of severe anemia previously diagnosed by their former provider at [facility], though they express concern that proper testing including iron  levels was not completed. They are seeking guidance on maintaining both a low cholesterol diet and a high iron  diet simultaneously, which they find challenging to navigate.  The patient has a significant history of attention deficit hyperactivity disorder diagnosed at age [age], for which they were successfully treated with stimulant medications during childhood. They discontinued their ADHD medication at age 73 as a teenager, believing they no longer needed it. Now at age 51, they report severe functional impairment due to inability to focus on tasks, describing it as being unable to live normally due to concentration difficulties. Their previous provider refused to restart ADHD medications or provide psychiatric referral, citing the length of time off medication as evidence they did not require treatment.  They report a family history of bipolar disorder in their father, who also has ADHD, and anxiety and depression in their mother, who also has ADHD. The patient was previously diagnosed with a mood disorder during adolescence, as bipolar disorder cannot be diagnosed before age 81. They describe experiencing anger outbursts that they believe may be related to their untreated ADHD, though they acknowledge this could have other causes given their family psychiatric  history.  The patient had a negative experience with a psychiatrist who incorrectly documented cocaine addiction when they had only reported occasional recreational use at social events between ages [age], which was not addiction-level use. This provider also required marijuana cessation and clean urine testing before considering stimulant medication, despite the patient's clear history of childhood ADHD diagnosis and previous successful stimulant treatment. The psychiatrist prescribed Prozac which caused profuse sweating and sleep disturbances, then increased the dose to 40 milligrams when the patient reported inadequacy. They were also prescribed sertraline concurrently, both medications causing severe sweating and gastrointestinal issues in someone who already has baseline stomach problems.  Regarding gastrointestinal concerns, the patient reports chronic constipation with bowel movements occurring only once to three times per week, sometimes only once weekly. They experience chronic bloating, fatigue most days, and random tingling in hands and feet. They suspect possible celiac disease given their extensive gastrointestinal history. They report chest burning at night, though they have had heartburn since age 69.  The patient describes recurrent ear problems since having bilateral ear infections last year, three months apart, with ongoing drainage and random ear pain. They report possible calcium deficiency concerns related to recently discovered lactose intolerance, with associated bone pain occurring intermittently. They experience dizziness spells and episodes where they feel their body is extremely heavy. They have itchy skin before bedtime, though they have known eczema and sensitive skin.  They report fluid retention in ankles and wrists with associated pain, and hand soreness upon waking. The patient experiences significant mood symptoms including anger outbursts, anxiety, depression, and  sadness.  Regarding gynecological history, she had recurrent urinary tract infections from ages [age], then experienced recurrent bacterial vaginosis and yeast infections  from 2021-2022 until approximately five months ago. These infections resolved after discontinuing soap use for genital hygiene per medical advice, though they initially had difficulty accepting this recommendation. She reports extremely heavy menstrual periods requiring ultra tampons plus pads, sometimes bleeding through both. Her periods typically last five to six days but recently have extended to eight to nine days. She experiences severe dysmenorrhea requiring bed rest for two to three days monthly, with cramping beginning one week before menstruation. She also reports significant premenstrual mood changes that were previously dismissed as premenstrual dysphoric disorder with no treatment offered. She has had adverse reactions to hormonal contraceptives, specifically severe depression with suicidal ideation, leading them to discontinue all hormonal birth control methods.  The patient reports a history of miscarriage in 2021 or 2022. She have concerns about possible endometriosis given their severe menstrual symptoms and large blood clots.  She reports episodes of severe hunger followed rapidly by nausea within ten minutes, making eating difficult despite marijuana use helping with appetite. She acknowledges marijuana use for managing anxiety and appetite issues related to their untreated ADHD and associated functional impairment.    Outpatient Encounter Medications as of 10/28/2023  Medication Sig   albuterol  (VENTOLIN  HFA) 108 (90 Base) MCG/ACT inhaler TAKE 2 PUFFS BY MOUTH EVERY 6 HOURS AS NEEDED FOR WHEEZE OR SHORTNESS OF BREATH (Patient not taking: Reported on 10/28/2023)   cetirizine  (ZYRTEC ) 10 MG tablet Take 1 tablet (10 mg total) by mouth daily. (Patient not taking: Reported on 10/28/2023)   omeprazole  (PRILOSEC) 20 MG  capsule Take 1 capsule (20 mg total) by mouth 2 (two) times daily before a meal. (Patient not taking: Reported on 10/28/2023)   No facility-administered encounter medications on file as of 10/28/2023.    Past Medical History:  Diagnosis Date   ADD (attention deficit disorder)    No meds since 2016   Anxiety    Asthma    BMI 28.0-28.9,adult    Eczema    GERD (gastroesophageal reflux disease)    Motion sickness    [address]    Past Surgical History:  Procedure Laterality Date   NO PAST SURGERIES     NO PAST SURGERIES     TONSILLECTOMY     TONSILLECTOMY AND ADENOIDECTOMY N/A 03/21/2016   Procedure: TONSILLECTOMY AND ADENOIDECTOMY;  Surgeon: Carolee Hunter, MD;  Location: [facility];  Service: ENT;  Laterality: N/A;  Latex sensitivity  adenoids cauterized no tissue sent    Family History  Problem Relation Age of Onset   Asthma Sister    Anxiety disorder Sister    Hypertension Maternal Grandmother    Diabetes Maternal Grandfather    Thyroid disease Paternal Grandmother     Social History   Socioeconomic History   Marital status: Single    Spouse name: Not on file   Number of children: Not on file   Years of education: Not on file   Highest education level: Not on file  Occupational History   Not on file  Tobacco Use   Smoking status: Every Day    Current packs/day: 0.00    Types: Cigarettes, E-cigarettes    Last attempt to quit: 01/15/2018    Years since quitting: 5.7   Smokeless tobacco: Former   Tobacco comments:    Quit 4-5 months ago   Vaping Use   Vaping status: Every Day   Start date: 01/16/2019  Substance and Sexual Activity   Alcohol use: No    Alcohol/week: 0.0 standard drinks of  alcohol   Drug use: Not Currently    Types: Marijuana   Sexual activity: Yes    Birth control/protection: None  Other Topics Concern   Not on file  Social History Narrative   Not on file   Social Drivers of Health   Financial Resource Strain: Not on  file  Food Insecurity: Not on file  Transportation Needs: No Transportation Needs (03/19/2022)   PRAPARE - Administrator, Civil Service (Medical): No    Lack of Transportation (Non-Medical): No  Physical Activity: Not on file  Stress: Not on file  Social Connections: Unknown (05/30/2021)   Received from [facility]   Social Network    Social Network: Not on file  Intimate Partner Violence: Unknown (04/21/2021)   Received from [facility]   HITS    Physically Hurt: Not on file    Insult or Talk Down To: Not on file    Threaten Physical Harm: Not on file    Scream or Curse: Not on file    Review of Systems  All other systems reviewed and are negative.       Objective   BP 96/70   Pulse 83   Ht 5' 7.5 (1.715 m)   Wt 201 lb 3.2 oz (91.3 kg)   SpO2 99%   BMI 31.05 kg/m   Physical Exam Vitals and nursing note reviewed.  Constitutional:      Appearance: Normal appearance. She is normal weight.  HENT:     Head: Normocephalic.  Eyes:     Extraocular Movements: Extraocular movements intact.     Conjunctiva/sclera: Conjunctivae normal.     Pupils: Pupils are equal, round, and reactive to light.  Cardiovascular:     Rate and Rhythm: Normal rate.  Pulmonary:     Effort: Pulmonary effort is normal.  Musculoskeletal:     Cervical back: Normal range of motion.  Neurological:     General: No focal deficit present.     Mental Status: She is alert and oriented to person, place, and time. Mental status is at baseline.  Psychiatric:        Mood and Affect: Mood normal.        Behavior: Behavior normal.        Thought Content: Thought content normal.        Judgment: Judgment normal.       Assessment & Plan Dyslipidemia Checking labs today.  Continue current therapy for lipid control. Will modify as needed based on labwork results.   Anxiety Will determine next steps at follow up, after lab evaluation due to possibility of medical reasoning.   B12  deficiency due to diet Other fatigue Iron  deficiency Dizziness - Vitamin B12 level assessment given fatigue, tingling symptoms, dizziness, and heavy feeling which can be associated with B12 deficiency - Comprehensive laboratory evaluation including complete iron  panel to properly assess for iron  deficiency anemia given previous and ongoing symptoms - Complete thyroid panel given family history of thyroid conditions and current symptom complex  Skin sensitivity - Environmental allergen panel to evaluate for environmental sensitivities contributing to symptoms  Generalized abdominal pain Postprandial bloating Chronic idiopathic constipation - Inflammatory bowel disease panel including testing for Crohn's disease and ulcerative colitis given chronic gastrointestinal symptoms - Celiac disease testing given constellation of gastrointestinal symptoms and family discussion of possible celiac disease Attention deficit hyperactivity disorder (ADHD), predominantly hyperactive type Therapeutic drug monitoring - ADHD medication restart consideration pending review of childhood medical records from [facility]  and [employer], with urine drug screen that will not exclude treatment based on marijuana use aloneADHD medication restart consideration pending review of childhood medical records from [facility] and [employer], with urine drug screen that will not exclude treatment based on marijuana use alone    - Antinuclear antibody testing to screen for autoimmune conditions given symptom constellation - Hormone level evaluation given menstrual irregularities and severe premenstrual symptoms - Follow-up appointment scheduled in two weeks to review laboratory results and determine next steps for management   Return in about 2 weeks (around 11/11/2023).   Total time spent: 60 minutes  Cindy CHRISTELLA ARRANT, FNP  10/28/2023  This document may have been prepared by  [facility] Voice Recognition software and as such may include unintentional dictation errors.

## 2023-10-31 LAB — TOXASSURE SELECT 13 (MW), URINE

## 2023-10-31 IMAGING — US US PELVIS COMPLETE TRANSABD/TRANSVAG W DUPLEX AND/OR DOPPLER
1 series · 13 of 25 positions shown · non-contrast
Comparison: None.

CLINICAL DATA: Lower abdominal pain for 3 days. Negative pregnancy
test.

EXAM:
TRANSABDOMINAL AND TRANSVAGINAL ULTRASOUND OF PELVIS
DOPPLER ULTRASOUND OF OVARIES
TECHNIQUE: Both transabdominal and transvaginal ultrasound examinations of the
pelvis were performed. Transabdominal technique was performed for
global imaging of the pelvis including uterus, ovaries, adnexal
regions, and pelvic cul-de-sac.
It was necessary to proceed with endovaginal exam following the
transabdominal exam to visualize the endometrium and ovaries. Color
and duplex Doppler ultrasound was utilized to evaluate blood flow to
the ovaries.

[Series 1: us pelvic complete w transvaginal and torsion righ · 13 of 76 slices shown]
[im 1/76]
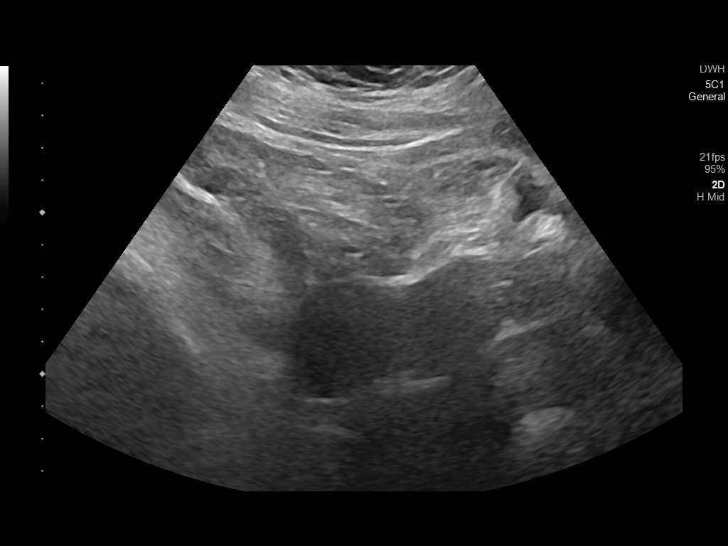
[im 7/76]
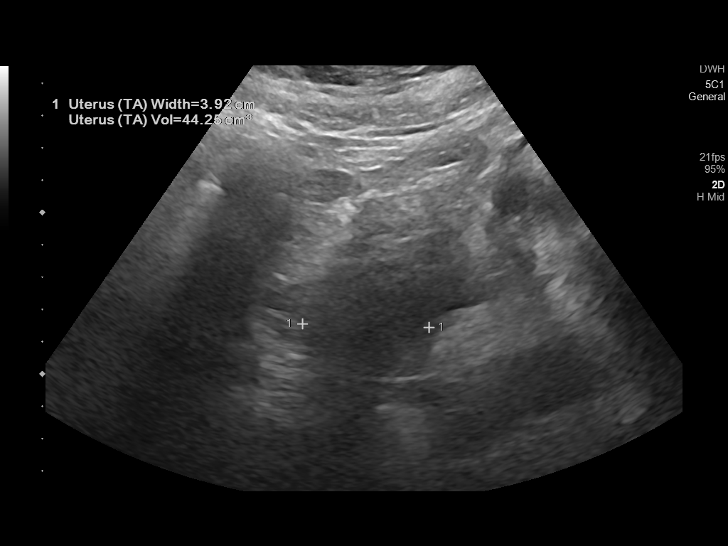
[im 13/76]
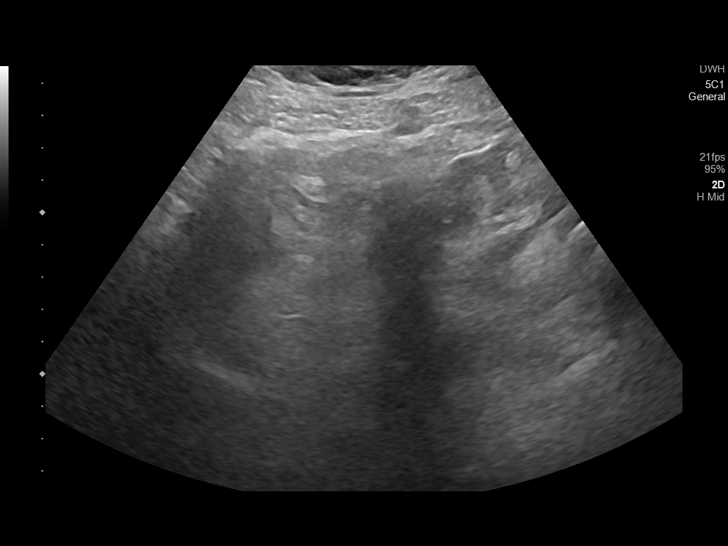
[im 19/76]
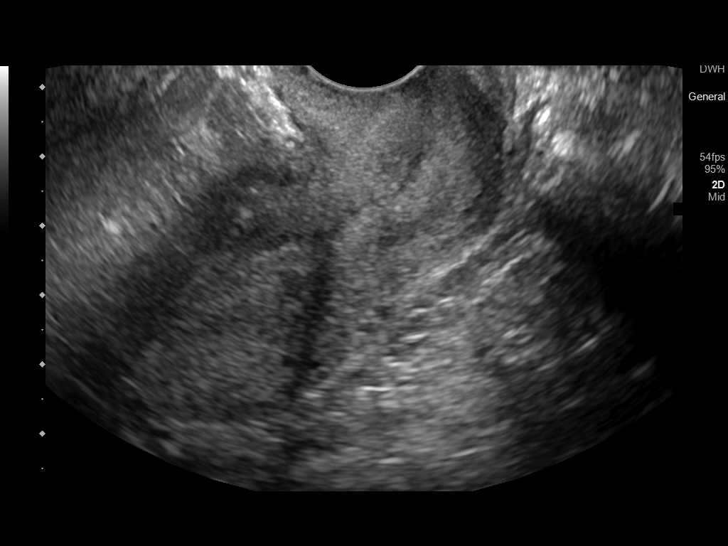
[im 26/76]
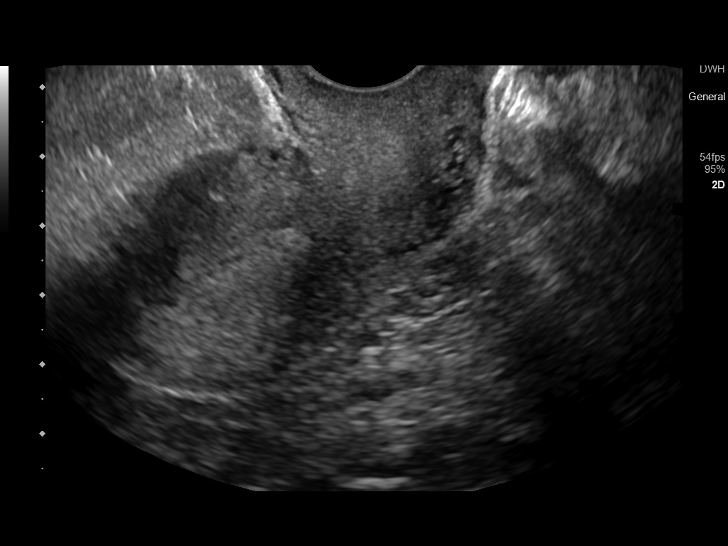
[im 32/76]
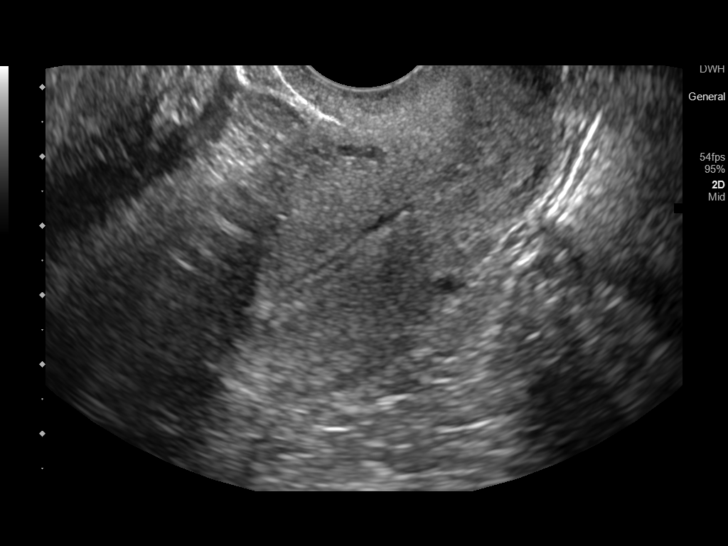
[im 38/76]
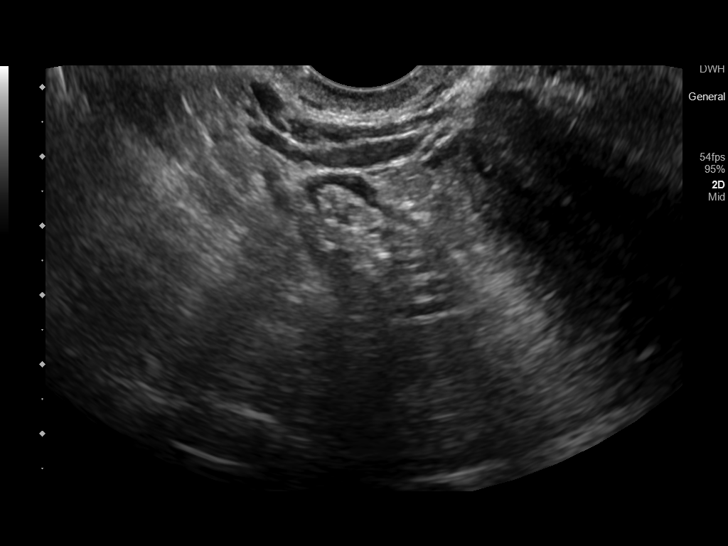
[im 44/76]
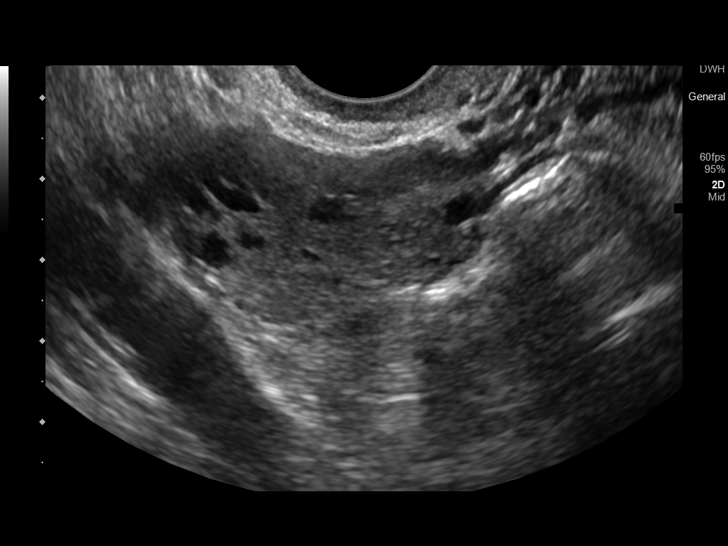
[im 51/76]
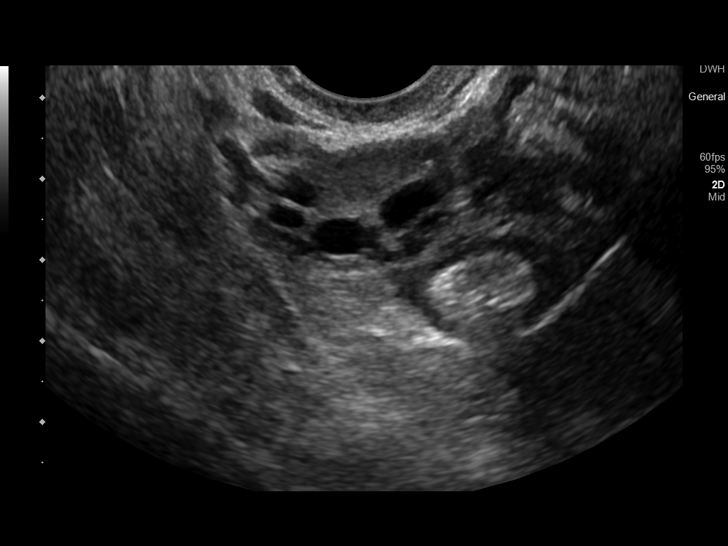
[im 57/76]
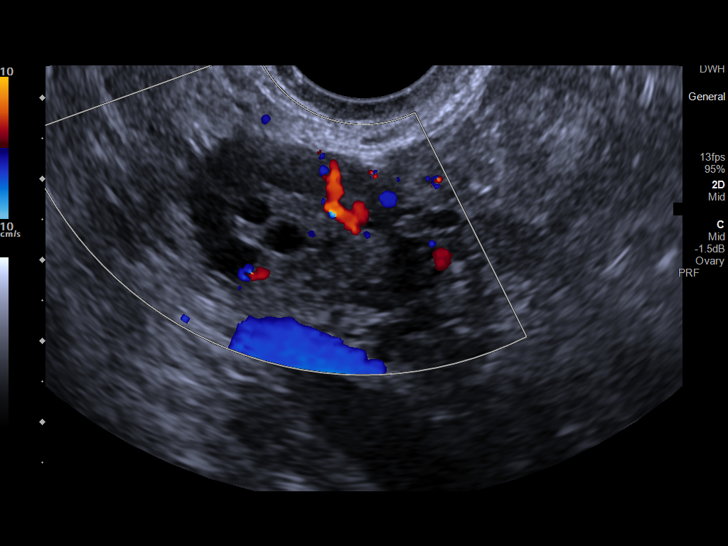
[im 63/76]
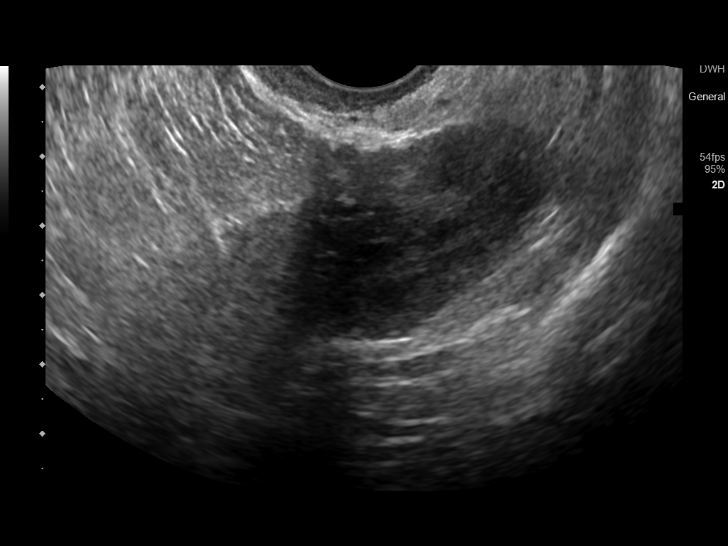
[im 69/76]
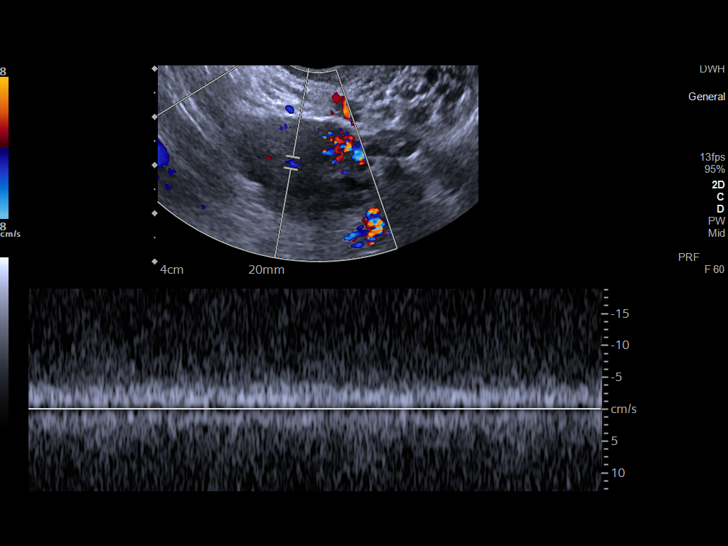
[im 76/76]
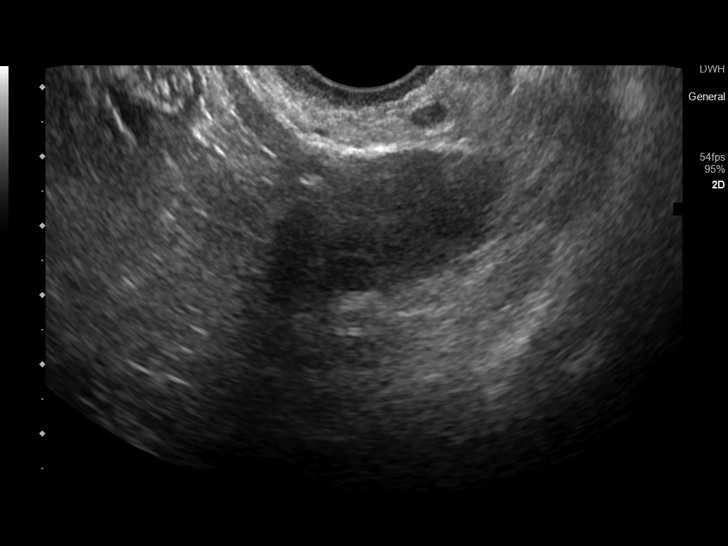

[13 of 25 positions shown; findings below may reference images not displayed]

FINDINGS: Uterus

Measurements: 6.3 x 3.3 x 4.6 cm = volume: 49.3 mL. No fibroids or
other mass visualized.

Endometrium

Thickness: 7.7 mm.  No focal abnormality visualized.

Right ovary

Measurements: 3.8 x 2.1 x 4.0 cm = volume: 16.7 mL. Normal
appearance/no adnexal mass. Multiple small simple follicles.

Left ovary

Measurements: 4.1 x 2.3 x 3.0 cm = volume: 14.6 mL. Normal
appearance/no adnexal mass. Multiple small simple follicles.

Pulsed Doppler evaluation of both ovaries demonstrates normal
low-resistance arterial and venous waveforms.

Other findings

No abnormal free fluid.
IMPRESSION: 1. No evidence of endometrial thickening, uterine mass, ovarian
mass, or ovarian torsion.
2. Generous sized ovaries with multiple follicles. The patient may
have polycystic ovaries but this was an emergent scan therefore cine
loops of the ovaries to count the total number of follicles in each
ovary were not performed. Clinical correlation suggested. Repeat
study to count the number follicles in the ovaries can be performed
on a nonemergent basis if needed.

## 2023-11-04 ENCOUNTER — Ambulatory Visit (INDEPENDENT_AMBULATORY_CARE_PROVIDER_SITE_OTHER): Admitting: Licensed Clinical Social Worker

## 2023-11-04 DIAGNOSIS — Z91199 Patient's noncompliance with other medical treatment and regimen due to unspecified reason: Secondary | ICD-10-CM

## 2023-11-04 NOTE — Progress Notes (Signed)
 [facility] Collaborative Care Clinician attempted to connect with patient for scheduled appointment in office. Patient did not show up for the appointment. After waiting 15 minutes, clinician attempted to reach pt by phone to offer virtual visit.   Pt did not answer phone and clinician was unable to leave message due to voice mail box full.

## 2023-11-09 ENCOUNTER — Encounter: Payer: Self-pay | Admitting: Family

## 2023-11-09 NOTE — Assessment & Plan Note (Signed)
-   ADHD medication restart consideration pending review of childhood medical records from [facility] and [employer], with urine drug screen that will not exclude treatment based on marijuana use aloneADHD medication restart consideration pending review of childhood medical records from [facility] and [employer], with urine drug screen that will not exclude treatment based on marijuana use alone

## 2023-11-09 NOTE — Assessment & Plan Note (Signed)
 Will determine next steps at follow up, after lab evaluation due to possibility of medical reasoning.

## 2023-11-09 NOTE — Assessment & Plan Note (Signed)
 Checking labs today.  Continue current therapy for lipid control. Will modify as needed based on labwork results.

## 2023-11-11 ENCOUNTER — Encounter: Payer: Self-pay | Admitting: Family

## 2023-11-11 ENCOUNTER — Ambulatory Visit: Admitting: Family

## 2023-11-12 LAB — IRON,TIBC AND FERRITIN PANEL
Ferritin: 107 ng/mL (ref 15–150)
Iron Saturation: 28 % (ref 15–55)
Iron: 96 ug/dL (ref 27–159)
Total Iron Binding Capacity: 348 ug/dL (ref 250–450)
UIBC: 252 ug/dL (ref 131–425)

## 2023-11-12 LAB — IBD EXPANDED PANEL
ACCA: 16 U (ref 0–90)
ALCA: 17 U (ref 0–60)
Atypical pANCA: NEGATIVE
gASCA: 38 U (ref 0–50)

## 2023-11-12 LAB — TSH+T4F+T3FREE
Free T4: 1.4 ng/dL (ref 0.82–1.77)
T3, Free: 3.9 pg/mL (ref 2.0–4.4)
TSH: 2.36 u[IU]/mL (ref 0.450–4.500)

## 2023-11-12 LAB — ANA 12 PROFILE, DO ALL RDL
Anti-Cardiolipin Ab, IgA (RDL): <12 U/mL (ref <12)
Anti-Cardiolipin Ab, IgG (RDL): <15 GPL U/mL (ref <15)
Anti-Cardiolipin Ab, IgM (RDL): <13 [MPL'U]/mL (ref <13)
Anti-Centromere Ab (RDL): <1:40 {titer}
Anti-La (SS-B) Ab (RDL): 21 U — ABNORMAL HIGH (ref <20)
Anti-Nuclear Ab by IFA (RDL): POSITIVE — AB
Anti-Ro (SS-A) Ab (RDL): <20 U (ref <20)
Anti-Scl-70 Ab (RDL): <20 U (ref <20)
Anti-Sm Ab (RDL): <20 U (ref <20)
Anti-TPO Ab (RDL): <9 [IU]/mL (ref <9.0)
Anti-U1 RNP Ab (RDL): <20 U (ref <20)
Anti-dsDNA Ab by Farr(RDL): <8 [IU]/mL (ref <8.0)
C3 Complement (RDL): 176 mg/dL (ref 90–180)
C4 Complement (RDL): 29 mg/dL (ref 10–40)

## 2023-11-12 LAB — ALLERGENS W/COMP RFLX AREA 2
Alternaria Alternata IgE: 6.92 kU/L — AB
Aspergillus Fumigatus IgE: <0.1 kU/L
Bermuda Grass IgE: <0.1 kU/L
Cedar, Mountain IgE: <0.1 kU/L
Cladosporium Herbarum IgE: <0.1 kU/L
Cockroach, German IgE: 0.16 kU/L — AB
Common Silver Birch IgE: <0.1 kU/L
Cottonwood IgE: <0.1 kU/L
D Farinae IgE: <0.1 kU/L
D Pteronyssinus IgE: <0.1 kU/L
E001-IgE Cat Dander: 0.54 kU/L — AB
E005-IgE Dog Dander: 0.19 kU/L — AB
Elm, American IgE: <0.1 kU/L
IgE (Immunoglobulin E), Serum: 145 [IU]/mL (ref 6–495)
Johnson Grass IgE: 1.09 kU/L — AB
Maple/Box Elder IgE: <0.1 kU/L
Mouse Urine IgE: <0.1 kU/L
Oak, White IgE: <0.1 kU/L
Pecan, Hickory IgE: <0.1 kU/L
Penicillium Chrysogen IgE: <0.1 kU/L
Pigweed, Rough IgE: <0.1 kU/L
Ragweed, Short IgE: 0.4 kU/L — AB
Sheep Sorrel IgE Qn: <0.1 kU/L
Timothy Grass IgE: 1.31 kU/L — AB
White Mulberry IgE: <0.1 kU/L

## 2023-11-12 LAB — CELIAC DISEASE COMPREHENSIVE PANEL WITH REFLEXES
IgA/Immunoglobulin A, Serum: 195 mg/dL (ref 87–352)
Transglutaminase IgA: <2 U/mL (ref 0–3)

## 2023-11-12 LAB — VITAMIN B12: Vitamin B-12: 290 pg/mL (ref 232–1245)

## 2023-11-12 LAB — PANEL 606578
E094-IgE Fel d 1: 0.42 kU/L — AB
E220-IgE Fel d 2: <0.1 kU/L
E228-IgE Fel d 4: <0.1 kU/L

## 2023-11-12 LAB — ANA TITER AND PATTERN: Speckled Pattern: 1:40 {titer} — ABNORMAL HIGH

## 2023-11-14 ENCOUNTER — Other Ambulatory Visit: Payer: Self-pay

## 2023-12-04 ENCOUNTER — Encounter: Payer: Self-pay | Admitting: Certified Nurse Midwife

## 2023-12-04 ENCOUNTER — Ambulatory Visit: Admitting: Certified Nurse Midwife

## 2023-12-04 ENCOUNTER — Other Ambulatory Visit (HOSPITAL_COMMUNITY)
Admission: RE | Admit: 2023-12-04 | Discharge: 2023-12-04 | Disposition: A | Discharge status: Home / Self Care | Source: Ambulatory Visit | Attending: Certified Nurse Midwife | Admitting: Certified Nurse Midwife

## 2023-12-04 VITALS — BP 111/72 | HR 80 | Ht 67.0 in | Wt 202.5 lb

## 2023-12-04 DIAGNOSIS — N920 Excessive and frequent menstruation with regular cycle: Secondary | ICD-10-CM | POA: Insufficient documentation

## 2023-12-04 DIAGNOSIS — Z124 Encounter for screening for malignant neoplasm of cervix: Secondary | ICD-10-CM | POA: Diagnosis present

## 2023-12-04 DIAGNOSIS — Z01419 Encounter for gynecological examination (general) (routine) without abnormal findings: Secondary | ICD-10-CM

## 2023-12-04 DIAGNOSIS — N898 Other specified noninflammatory disorders of vagina: Secondary | ICD-10-CM | POA: Diagnosis not present

## 2023-12-04 LAB — CBC WITH DIFFERENTIAL/PLATELET
Basophils Absolute: 0.1 x10E3/uL (ref 0.0–0.2)
Basos: 1 %
EOS (ABSOLUTE): 0.1 x10E3/uL (ref 0.0–0.4)
Eos: 2 %
Hematocrit: 39.9 % (ref 34.0–46.6)
Hemoglobin: 12.4 g/dL (ref 11.1–15.9)
Immature Grans (Abs): 0 x10E3/uL (ref 0.0–0.1)
Immature Granulocytes: 0 %
Lymphocytes Absolute: 2.5 x10E3/uL (ref 0.7–3.1)
Lymphs: 27 %
MCH: 24.9 pg — ABNORMAL LOW (ref 26.6–33.0)
MCHC: 31.1 g/dL — ABNORMAL LOW (ref 31.5–35.7)
MCV: 80 fL (ref 79–97)
Monocytes Absolute: 0.5 x10E3/uL (ref 0.1–0.9)
Monocytes: 5 %
Neutrophils Absolute: 6 x10E3/uL (ref 1.4–7.0)
Neutrophils: 65 %
Platelets: 293 x10E3/uL (ref 150–450)
RBC: 4.97 x10E6/uL (ref 3.77–5.28)
RDW: 15.4 % (ref 11.7–15.4)
WBC: 9.2 x10E3/uL (ref 3.4–10.8)

## 2023-12-04 NOTE — Progress Notes (Cosign Needed Addendum)
 Subjective:     Cindy Matthews is a 28 y.o. female here at [facility] for a routine exam.  Current complaints: of having heavy cycles for the first 3 days. She states that she soaks through a super plus tampon and overnight pad every hours during this time, sometimes get dizzy. Personal and family health history reviewed: yes.  Do you have a primary care provider? Yes Do you feel safe at home? Yes  Flowsheet Row Office Visit from 12/04/2023 in [facility] for [facility]  PHQ-2 Total Score 1    Health Maintenance Due  Topic Date Due   Hepatitis C Screening  Never done   Pneumococcal Vaccine (1 of 2 - PCV) 09/03/2016   Cervical Cancer Screening (Pap smear)  Never done   DTaP/Tdap/Td (7 - Td or Tdap) 12/21/2018   Influenza Vaccine  08/16/2023   COVID-19 Vaccine (1 - 2025-26 season) Never done     Risk factors for chronic health problems: none Smoking: Vape 20-50 hits per day Alchohol/how much: occasionally 2-3 times per year Pt BMI: Body mass index is 31.72 kg/m.   Gynecologic History Patient's last menstrual period was 11/14/2023 (exact date). Contraception: none Last Pap: Never. Results were: N/A Last mammogram: N/A. Results were: N/A  Obstetric History OB History  Gravida Para Term Preterm AB Living  1    1   SAB IAB Ectopic Multiple Live Births  1        # Outcome Date GA Lbr Len/2nd Weight Sex Type Anes PTL Lv  1 SAB 03/2017             The following portions of the patient's history were reviewed and updated as appropriate: allergies, current medications, past family history, past medical history, past social history, past surgical history, and problem list.  Review of Systems Pertinent items are noted in HPI. Pertinent items noted in HPI and remainder of comprehensive ROS otherwise negative.    Objective:   Today's Vitals   12/04/23 1435  BP: 111/72  Pulse: 80  Weight: 91.9 kg  Height: 5' 7 (1.702 m)   Body mass  index is 31.72 kg/m.  VS reviewed, nursing note reviewed,  Constitutional: well developed, well nourished, no distress HEENT: normocephalic, thyroid  without enlargement or mass HEART: RRR, no murmurs rubs/gallops RESP: clear and equal to auscultation bilaterally in all lobes  Breast Exam:   exam performed: right breast normal without mass, skin or nipple changes or axillary nodes, left breast normal without mass, skin or nipple changes or axillary nodes Abdomen: soft Neuro: alert and oriented x 3 Skin: warm, dry Psych: affect normal Pelvic exam: Performed: Cervix pink, visually closed, without lesion, heavy white creamy discharge, vaginal walls and external genitalia normal Bimanual exam: Deferred      Assessment/Plan:  1. Encounter for well woman exam with routine gynecological exam (Primary) - Cervicovaginal ancillary only( [place]) - HIV antibody (with reflex) - RPR - CBC with Differential/Platelet - Vitamin B12 - Folate - Iron  and TIBC - Ferritin  2. Cervical cancer screening -Vaginal discharge -Desires Std screening - Cervicovaginal ancillary only( [place])  3. Vaginal discharge - Cervicovaginal ancillary only( [place])  4. Menorrhagia with regular cycle -Patient will review birth control options to help with heavy bleeding and schedule follow up - Cervicovaginal ancillary only( [place])    Return in about 3 months (around 03/05/2024) for birth control.   Derrek JINNY Freund, NP Student 3:56 PM

## 2023-12-05 LAB — VITAMIN B12: Vitamin B-12: 318 pg/mL (ref 232–1245)

## 2023-12-05 LAB — IRON AND TIBC
Iron Saturation: 12 % — ABNORMAL LOW (ref 15–55)
Iron: 58 ug/dL (ref 27–159)
Total Iron Binding Capacity: 493 ug/dL — ABNORMAL HIGH (ref 250–450)
UIBC: 435 ug/dL — ABNORMAL HIGH (ref 131–425)

## 2023-12-05 LAB — FERRITIN: Ferritin: 12 ng/mL — ABNORMAL LOW (ref 15–150)

## 2023-12-05 LAB — HIV ANTIBODY (ROUTINE TESTING W REFLEX): HIV Screen 4th Generation wRfx: NONREACTIVE

## 2023-12-05 LAB — FOLATE: Folate: >20 ng/mL (ref >3.0)

## 2023-12-05 LAB — RPR: RPR Ser Ql: NONREACTIVE

## 2023-12-06 ENCOUNTER — Encounter: Payer: Self-pay | Admitting: Certified Nurse Midwife

## 2023-12-06 DIAGNOSIS — Z124 Encounter for screening for malignant neoplasm of cervix: Secondary | ICD-10-CM

## 2023-12-09 ENCOUNTER — Encounter: Payer: Self-pay | Admitting: Certified Nurse Midwife

## 2023-12-09 ENCOUNTER — Other Ambulatory Visit: Payer: Self-pay | Admitting: *Deleted

## 2023-12-09 LAB — CERVICOVAGINAL ANCILLARY ONLY
Bacterial Vaginitis (gardnerella): POSITIVE — AB
Candida Glabrata: NEGATIVE
Candida Vaginitis: NEGATIVE
Chlamydia: NEGATIVE
Comment: NEGATIVE
Comment: NEGATIVE
Comment: NEGATIVE
Comment: NEGATIVE
Comment: NEGATIVE
Comment: NORMAL
Neisseria Gonorrhea: NEGATIVE
Trichomonas: NEGATIVE

## 2023-12-09 MED ORDER — METRONIDAZOLE 500 MG PO TABS: 500.0000 mg | ORAL_TABLET | Freq: Two times a day (BID) | ORAL | 0 refills | Status: DC

## 2023-12-09 NOTE — Progress Notes (Signed)
 Pt needs RX sent to [facility] in [place]

## 2023-12-10 ENCOUNTER — Ambulatory Visit: Payer: Self-pay | Admitting: Certified Nurse Midwife

## 2023-12-10 ENCOUNTER — Other Ambulatory Visit: Payer: Self-pay | Admitting: *Deleted

## 2023-12-10 LAB — CYTOLOGY - PAP: Diagnosis: NEGATIVE

## 2023-12-10 MED ORDER — FLUCONAZOLE 150 MG PO TABS: 150.0000 mg | ORAL_TABLET | ORAL | 3 refills | Status: DC

## 2023-12-10 NOTE — Progress Notes (Signed)
Previous RX sent to wrong pharmacy

## 2023-12-20 ENCOUNTER — Ambulatory Visit: Payer: Self-pay

## 2023-12-25 ENCOUNTER — Other Ambulatory Visit (HOSPITAL_COMMUNITY)
Admission: RE | Admit: 2023-12-25 | Discharge: 2023-12-25 | Disposition: A | Source: Ambulatory Visit | Attending: Certified Nurse Midwife | Admitting: Certified Nurse Midwife

## 2023-12-25 ENCOUNTER — Ambulatory Visit: Admitting: Certified Nurse Midwife

## 2023-12-25 VITALS — BP 93/62 | HR 103 | Wt 201.5 lb

## 2023-12-25 DIAGNOSIS — N760 Acute vaginitis: Secondary | ICD-10-CM | POA: Diagnosis not present

## 2023-12-25 DIAGNOSIS — F339 Major depressive disorder, recurrent, unspecified: Secondary | ICD-10-CM | POA: Diagnosis not present

## 2023-12-25 DIAGNOSIS — Z30016 Encounter for initial prescription of transdermal patch hormonal contraceptive device: Secondary | ICD-10-CM | POA: Diagnosis not present

## 2023-12-25 DIAGNOSIS — B9689 Other specified bacterial agents as the cause of diseases classified elsewhere: Secondary | ICD-10-CM | POA: Insufficient documentation

## 2023-12-25 DIAGNOSIS — Z3009 Encounter for other general counseling and advice on contraception: Secondary | ICD-10-CM

## 2023-12-25 DIAGNOSIS — Z3202 Encounter for pregnancy test, result negative: Secondary | ICD-10-CM

## 2023-12-25 LAB — POCT URINE PREGNANCY: Preg Test, Ur: NEGATIVE

## 2023-12-25 MED ORDER — NORELGESTROMIN-ETH ESTRADIOL 150-35 MCG/24HR TD PTWK: 1.0000 | MEDICATED_PATCH | TRANSDERMAL | 0 refills | Status: AC

## 2023-12-25 NOTE — Progress Notes (Signed)
° °  GYNECOLOGY PROGRESS NOTE  History:  28 y.o. G1P0010 presents to [facility] office today for problem gyn visit. She reports wanting to be re-checked for BV to make sure infection has cleared. She would also like to discuss initiation of the birth control patch. She denies h/a, dizziness, shortness of breath, n/v, or fever/chills.    The following portions of the patient's history were reviewed and updated as appropriate: allergies, current medications, past family history, past medical history, past social history, past surgical history and problem list. Last pap smear on 12/06/23 was normal.  Health Maintenance Due  Topic Date Due   Hepatitis C Screening  Never done   Pneumococcal Vaccine (1 of 2 - PCV) 09/03/2016   DTaP/Tdap/Td (7 - Td or Tdap) 12/21/2018   Influenza Vaccine  08/16/2023   COVID-19 Vaccine (1 - 2025-26 season) Never done     Review of Systems:  Pertinent items are noted in HPI.   Objective:  Physical Exam Blood pressure 93/62, pulse (!) 103, weight 91.4 kg, last menstrual period 11/14/2023. VS reviewed, nursing note reviewed,  Constitutional: well developed, well nourished, no distress HEENT: normocephalic CV: normal rate Pulm/chest wall: normal effort Breast Exam: deferred Abdomen: soft Neuro: alert and oriented x 3 Skin: warm, dry Psych: affect normal Pelvic exam: Deferred Bimanual exam: Deferred  Assessment & Plan:  1. Encounter for counseling regarding contraception (Primary) -Counseled patient on risks of COC with nicotine  use and BMI.  -Educated on S/S of DVT and to d/c patch immediately if she develops symptoms  2. Encounter for initial prescription of transdermal patch hormonal contraceptive device -Xulane Rx sent to the pharmacy sent to the pharmacy -Follow up in 6 weeks  3. BV (bacterial vaginosis) -Repeat vaginal swab to ensure complete resolution of infection -If positive results, consider Boric Acid Regimen   4. Depression,  recurrent -Referral for [facility]   Return in about 6 weeks (around 02/05/2024) for Birth Control F/U.   Derrek JINNY Freund, NP Student 4:50 PM

## 2023-12-27 LAB — CERVICOVAGINAL ANCILLARY ONLY
Bacterial Vaginitis (gardnerella): NEGATIVE
Candida Glabrata: NEGATIVE
Candida Vaginitis: NEGATIVE
Comment: NEGATIVE
Comment: NEGATIVE
Comment: NEGATIVE

## 2023-12-31 ENCOUNTER — Ambulatory Visit: Payer: Self-pay | Admitting: Certified Nurse Midwife

## 2024-01-08 ENCOUNTER — Ambulatory Visit: Admitting: Family

## 2024-01-08 ENCOUNTER — Encounter: Payer: Self-pay | Admitting: Family

## 2024-01-08 VITALS — BP 112/78 | HR 82 | Ht 67.0 in | Wt 201.4 lb

## 2024-01-08 DIAGNOSIS — Z6831 Body mass index (BMI) 31.0-31.9, adult: Secondary | ICD-10-CM | POA: Diagnosis not present

## 2024-01-08 DIAGNOSIS — R7689 Other specified abnormal immunological findings in serum: Secondary | ICD-10-CM | POA: Diagnosis not present

## 2024-01-08 DIAGNOSIS — Z0130 Encounter for examination of blood pressure without abnormal findings: Secondary | ICD-10-CM

## 2024-01-08 DIAGNOSIS — J4520 Mild intermittent asthma, uncomplicated: Secondary | ICD-10-CM | POA: Diagnosis not present

## 2024-01-08 DIAGNOSIS — L309 Dermatitis, unspecified: Secondary | ICD-10-CM | POA: Diagnosis not present

## 2024-01-08 DIAGNOSIS — E6609 Other obesity due to excess calories: Secondary | ICD-10-CM

## 2024-01-08 DIAGNOSIS — E785 Hyperlipidemia, unspecified: Secondary | ICD-10-CM | POA: Diagnosis not present

## 2024-01-08 DIAGNOSIS — E66811 Obesity, class 1: Secondary | ICD-10-CM

## 2024-01-08 DIAGNOSIS — E611 Iron deficiency: Secondary | ICD-10-CM | POA: Diagnosis not present

## 2024-01-08 MED ORDER — ACCRUFER 30 MG PO CAPS: 30.0000 mg | ORAL_CAPSULE | Freq: Every day | ORAL | 1 refills | Status: AC

## 2024-01-08 NOTE — Assessment & Plan Note (Signed)
-   Rheumatology referral sent - Positive blood allergy testing; discussed future referral to allergist. Keep skin clean and dry and avoid exposure to allergens. - Continue medications as prescribed.

## 2024-01-08 NOTE — Assessment & Plan Note (Signed)
-   Reinforced healthy diet and exercise as tolerated.

## 2024-01-08 NOTE — Assessment & Plan Note (Signed)
-   Start accufer iron  supplement daily.

## 2024-01-08 NOTE — Progress Notes (Signed)
 "  Established Patient Office Visit  Subjective:  Patient ID: Cindy Matthews, female    DOB: 1996/01/08  Age: 28 y.o. MRN: [id number]  Chief Complaint  Patient presents with   Follow-up    Discuss test results. Joint pain and constant fatigue.     Patient is here today to discuss her recent labs in detail. Will send patient to rheumatologist based off positive ANA with speckled pattern. She reports severe joint pain on occasion and red patchy rash on her face that comes and goes.  Discussed future referral to allergist to discuss starting allergy shots. Patient reports losing her insurance soon so she would like to do this once she gets insurance again.  Patient will begin taking iron  supplementation daily. Order accufer as it does not cause GI upset and she has had hx of constipation from OTC iron  supplements.  Patient has rash antecubital region of right arm. She has not tried putting anything on it and reports it has been itching. Advised patient to try OTC cortisone 10 twice daily for 1-2 weeks until it resolves and if it does not improve or worsen will send in prescription strength steroid cream.      No other concerns at this time.   Past Medical History:  Diagnosis Date   ADD (attention deficit disorder)    No meds since 2016   Anxiety    Asthma    BMI 28.0-28.9,adult    Eczema    GERD (gastroesophageal reflux disease)    Motion sickness    curvy [address]    Past Surgical History:  Procedure Laterality Date   NO PAST SURGERIES     NO PAST SURGERIES     TONSILLECTOMY     TONSILLECTOMY AND ADENOIDECTOMY N/A 03/21/2016   Procedure: TONSILLECTOMY AND ADENOIDECTOMY;  Surgeon: Carolee Hunter, MD;  Location: [facility];  Service: ENT;  Laterality: N/A;  Latex sensitivity  adenoids cauterized no tissue sent    Social History   Socioeconomic History   Marital status: Single    Spouse name: Not on file   Number of children: Not on file   Years of  education: Not on file   Highest education level: Not on file  Occupational History   Not on file  Tobacco Use   Smoking status: Every Day    Current packs/day: 0.00    Average packs/day: 0.5 packs/day    Types: Cigarettes, E-cigarettes    Last attempt to quit: 01/15/2018    Years since quitting: 5.9   Smokeless tobacco: Former   Tobacco comments:    Quit 4-5 months ago   Vaping Use   Vaping status: Every Day   Start date: 01/16/2019  Substance and Sexual Activity   Alcohol use: No    Alcohol/week: 0.0 standard drinks of alcohol   Drug use: Not Currently    Types: Marijuana   Sexual activity: Yes    Birth control/protection: None  Other Topics Concern   Not on file  Social History Narrative   Not on file   Social Drivers of Health   Tobacco Use: High Risk (01/08/2024)   Patient History    Smoking Tobacco Use: Every Day    Smokeless Tobacco Use: Former    Passive Exposure: Not on Actuary Strain: Not on file  Food Insecurity: Not on file  Transportation Needs: No Transportation Needs (03/19/2022)   PRAPARE - Transportation    Lack of Transportation (Medical): No    Lack  of Transportation (Non-Medical): No  Physical Activity: Not on file  Stress: Not on file  Social Connections: Unknown (05/30/2021)   Received from [facility]   Social Network    Social Network: Not on file  Intimate Partner Violence: Unknown (04/21/2021)   Received from [facility]   HITS    Physically Hurt: Not on file    Insult or Talk Down To: Not on file    Threaten Physical Harm: Not on file    Scream or Curse: Not on file  Depression (PHQ2-9): Medium Risk (12/04/2023)   Depression (PHQ2-9)    PHQ-2 Score: 9  Alcohol Screen: Low Risk (06/19/2022)   Alcohol Screen    Last Alcohol Screening Score (AUDIT): 3  Housing: Low Risk (11/02/2023)   Received from [facility]   Epic    What is your living situation today?: I have a steady place to live    Think about the place you  live. Do you have problems with any of the following? Choose all that apply:: None/None on this list  Utilities: Not on file  Health Literacy: Not on file    Family History  Problem Relation Age of Onset   Asthma Sister    Anxiety disorder Sister    Hypertension Maternal Grandmother    Diabetes Maternal Grandfather    Thyroid  disease Paternal Grandmother     Allergies[1]  Review of Systems  Constitutional:  Negative for malaise/fatigue.  HENT: Negative.    Eyes:  Negative for blurred vision and pain.  Respiratory:  Negative for cough and shortness of breath.   Cardiovascular:  Negative for chest pain, palpitations, claudication and leg swelling.  Gastrointestinal:  Negative for abdominal pain, blood in stool, constipation, diarrhea, nausea and vomiting.  Genitourinary:  Negative for dysuria, frequency and urgency.  Musculoskeletal: Negative.   Skin:  Positive for rash.  Neurological:  Positive for dizziness. Negative for tingling, sensory change and headaches.  Endo/Heme/Allergies: Negative.   Psychiatric/Behavioral: Negative.         Objective:   BP 112/78   Pulse 82   Ht 5' 7 (1.702 m)   Wt 201 lb 6.4 oz (91.4 kg)   SpO2 99%   BMI 31.54 kg/m   Vitals:   01/08/24 1103  BP: 112/78  Pulse: 82  Height: 5' 7 (1.702 m)  Weight: 201 lb 6.4 oz (91.4 kg)  SpO2: 99%  BMI (Calculated): 31.54    Physical Exam Vitals and nursing note reviewed.  Constitutional:      Appearance: Normal appearance.  HENT:     Head: Normocephalic.  Eyes:     Extraocular Movements: Extraocular movements intact.     Pupils: Pupils are equal, round, and reactive to light.  Cardiovascular:     Rate and Rhythm: Normal rate and regular rhythm.     Pulses: Normal pulses.     Heart sounds: Normal heart sounds. No murmur heard. Pulmonary:     Effort: Pulmonary effort is normal. No respiratory distress.     Breath sounds: Normal breath sounds.  Abdominal:     General: There is no  distension.     Tenderness: There is no abdominal tenderness.  Musculoskeletal:        General: No tenderness. Normal range of motion.     Cervical back: Normal range of motion and neck supple.     Right lower leg: No edema.     Left lower leg: No edema.  Skin:    General: Skin is warm  and dry.     Coloration: Skin is not jaundiced.     Findings: Rash present. No erythema. Rash is papular and scaling.      Neurological:     General: No focal deficit present.     Mental Status: She is alert and oriented to person, place, and time.  Psychiatric:        Mood and Affect: Mood normal.        Speech: Speech normal.        Behavior: Behavior is cooperative.        Cognition and Memory: Memory is not impaired.      No results found for any visits on 01/08/24.  Recent Results (from the past 2160 hours)  Iron , TIBC and Ferritin Panel     Status: None   Collection Time: 10/28/23  3:18 PM  Result Value Ref Range   Total Iron  Binding Capacity 348 250 - 450 ug/dL   UIBC 747 868 - 574 ug/dL   Iron  96 27 - 159 ug/dL   Iron  Saturation 28 15 - 55 %   Ferritin 107 15 - 150 ng/mL  Vitamin B12     Status: None   Collection Time: 10/28/23  3:18 PM  Result Value Ref Range   Vitamin B-12 290 232 - 1,245 pg/mL  Allergens w/Comp Rflx Area 2     Status: Abnormal   Collection Time: 10/28/23  3:18 PM  Result Value Ref Range   Class Description Allergens Comment     Comment:     Levels of Specific IgE       Class  Description of Class     ---------------------------  -----  --------------------                    < 0.10         0         Negative            0.10 -    0.31         0/I       Equivocal/Low            0.32 -    0.55         I         Low            0.56 -    1.40         II        Moderate            1.41 -    3.90         III       High            3.91 -   19.00         IV        Very High           19.01 -  100.00         V         Very High                   >100.00         VI         Very High    IgE (Immunoglobulin E), Serum 145 6 - 495 IU/mL   D Pteronyssinus IgE <0.10 Class 0 kU/L   D Farinae IgE <0.10 Class 0 kU/L  E001-IgE Cat Dander 0.54 (A) Class I kU/L   E005-IgE Dog Dander 0.19 (A) Class 0/I kU/L   Bermuda Grass IgE <0.10 Class 0 kU/L   Timothy Grass IgE 1.31 (A) Class II kU/L   Johnson Grass IgE 1.09 (A) Class II kU/L   Cockroach, German IgE 0.16 (A) Class 0/I kU/L   Penicillium Chrysogen IgE <0.10 Class 0 kU/L   Cladosporium Herbarum IgE <0.10 Class 0 kU/L   Aspergillus Fumigatus IgE <0.10 Class 0 kU/L   Alternaria Alternata IgE 6.92 (A) Class IV kU/L   Maple/Box Elder IgE <0.10 Class 0 kU/L   Common Silver Valrie IgE <0.10 Class 0 kU/L   Cedar, [place] IgE <0.10 Class 0 kU/L   Oak, White IgE <0.10 Class 0 kU/L   Elm, American IgE <0.10 Class 0 kU/L   Cottonwood IgE <0.10 Class 0 kU/L   Pecan, Hickory IgE <0.10 Class 0 kU/L   White Mulberry IgE <0.10 Class 0 kU/L   Ragweed, Short IgE 0.40 (A) Class I kU/L   Pigweed, Rough IgE <0.10 Class 0 kU/L   Sheep Sorrel IgE Qn <0.10 Class 0 kU/L   Mouse Urine IgE <0.10 Class 0 kU/L  IBD Expanded Panel     Status: None   Collection Time: 10/28/23  3:18 PM  Result Value Ref Range   gASCA 38 0 - 50 units    Comment:     Negative: <45  Equivocal: 45-50  Positive:  >50   ACCA 16 0 - 90 units    Comment:     Negative: <80  Equivocal: 80-90  Positive: >90   ALCA 17 0 - 60 units    Comment:     Negative:<55  Equivocal: 55-60 Positive: >60   AMCA CANCELED units    Comment: Test not performed. Unable to perform test due to current unavailability of reagents or discontinuation of test.     Negative: <90  Equivocal:  90-100  Positive: >100     This test was developed and its performance     characteristics determined by [facility]. It has not     been cleared or approved by the Food and Drug     Administration. The FDA has determined that such     clearance or approval is not necessary.  Result canceled by  the ancillary.    Atypical pANCA Negative Negative   Comments Performed   Celiac Disease Comprehensive Panel with Reflexes     Status: None   Collection Time: 10/28/23  3:18 PM  Result Value Ref Range   t-Transglutaminase (tTG) IgA <2 0 - 3 U/mL    Comment:                               Negative        0 -  3                               Weak Positive   4 - 10                               Positive           >10  Tissue Transglutaminase (tTG) has been identified  as the endomysial antigen.  Studies have demonstr-  ated that endomysial IgA antibodies have over 99%  specificity  for gluten sensitive enteropathy.    Immunoglobulin A, (IgA) QN, Serum 195 87 - 352 mg/dL  [id number]     Status: None   Collection Time: 10/28/23  3:18 PM  Result Value Ref Range   TSH 2.360 0.450 - 4.500 uIU/mL   T3, Free 3.9 2.0 - 4.4 pg/mL   Free T4 1.40 0.82 - 1.77 ng/dL  ANA 12 Profile, Do All RDL     Status: Abnormal   Collection Time: 10/28/23  3:18 PM  Result Value Ref Range   Anti-Nuclear Ab by IFA (RDL) Positive (A) Negative   Anti-Centromere Ab (RDL) <1:40 <1:40   Anti-dsDNA Ab by Farr(RDL) <8.0 <8.0 IU/mL   Anti-Sm Ab (RDL) <20 <20 Units   Anti-U1 RNP Ab (RDL) <20 <20 Units   Anti-Ro (SS-A) Ab (RDL) <20 <20 Units   Anti-La (SS-B) Ab (RDL) 21 (H) <20 Units   Anti-Scl-70 Ab (RDL) <20 <20 Units   Anti-Cardiolipin Ab, IgG (RDL) <15 <15 GPL U/mL   Anti-Cardiolipin Ab, IgA (RDL) <12 <12 APL U/mL   Anti-Cardiolipin Ab, IgM (RDL) <13 <13 MPL U/mL   C3 Complement (RDL) 176 90 - 180 mg/dL   C4 Complement (RDL) 29 10 - 40 mg/dL    Comment:               **Please note reference interval change**   Anti-TPO Ab (RDL) <9.0 <9.0 IU/mL    Comment:    Interpretation for Anti-Sm, Anti-U1 RNP, Anti-Ro,     Anti-La:         Negative:                                <20         Weak Positive:                       20 - 39         Moderate Positive:                   40 - 80         Strong Positive:                          >80         Strong Positive:                         >59    Interpretation for Anti-Cardiolipin Ab:     Negative:               GPL <15, APL <12, MPL <13     Indeterminate:    GPL 15-20, APL 12-20, MPL 13-20     Low Positive:           GPL, APL, MPL    >20 - 40     Med Positive:           GPL, APL, MPL    >40 - 80     High Positive:          GPL, APL, MPL         >80 SLE classification criteria are based on Med to High titer (>40) Anti-Cardiolipin Ab (aCL). aCL may be elevated transiently with certain infections and may increase spuriously in the presence of rheumatoid factor.   ANA Titer and Pattern  Status: Abnormal   Collection Time: 10/28/23  3:18 PM  Result Value Ref Range   Speckled Pattern 1:40 (H) <1:40   Note: Comment     Comment: ANA performed by Indirect Fluorescent Antibody (IFA)  Panel [id number]     Status: Abnormal   Collection Time: 10/28/23  3:18 PM  Result Value Ref Range   E094-IgE Fel d 1 0.42 (A) Class I kU/L   E220-IgE Fel d 2 <0.10 Class 0 kU/L   E228-IgE Fel d 4 <0.10 Class 0 kU/L  ToxASSURE Select 13 (MW), Urine     Status: None   Collection Time: 10/28/23  3:20 PM  Result Value Ref Range   Summary FINAL     Comment: ==================================================================== ToxASSURE Select 13 (MW) ==================================================================== Test                             Result       Flag       Units  Drug Present   Carboxy-THC                    479                     ng/mg creat    Carboxy-THC is a metabolite of tetrahydrocannabinol (THC). Source of    THC is most commonly herbal marijuana or marijuana-based products,    but THC is also present in a scheduled prescription medication.    Trace amounts of THC can be present in hemp and cannabidiol (CBD)    products. This test is not intended to distinguish between delta-9-    tetrahydrocannabinol, the predominant form of THC in most herbal or     marijuana-based products, and delta-8-tetrahydrocannabinol.  ==================================================================== Test                      Result    Flag   Units      Ref Range   Creatinine              165              mg/dL      >=2 0 ==================================================================== Declared Medications:  Medication list was not provided. ==================================================================== For clinical consultation, please call [phone]. ====================================================================   Cervicovaginal ancillary only( [place])     Status: Abnormal   Collection Time: 12/04/23  3:29 PM  Result Value Ref Range   Neisseria Gonorrhea Negative    Chlamydia Negative    Trichomonas Negative    Bacterial Vaginitis (gardnerella) Positive (A)    Candida Vaginitis Negative    Candida Glabrata Negative    Comment Normal Reference Range Candida Species - Negative    Comment Normal Reference Range Candida Galbrata - Negative    Comment Normal Reference Range Trichomonas - Negative    Comment      Normal Reference Range Bacterial Vaginosis - Negative   Comment Normal Reference Ranger Chlamydia - Negative    Comment      Normal Reference Range Neisseria Gonorrhea - Negative  HIV antibody (with reflex)     Status: None   Collection Time: 12/04/23  3:33 PM  Result Value Ref Range   HIV Screen 4th Generation wRfx Non Reactive Non Reactive    Comment: HIV-1/HIV-2 antibodies and HIV-1 p24 antigen were NOT detected. There is no laboratory evidence of HIV infection. HIV Negative   RPR  Status: None   Collection Time: 12/04/23  3:33 PM  Result Value Ref Range   RPR Ser Ql Non Reactive Non Reactive  Vitamin B12     Status: None   Collection Time: 12/04/23  3:33 PM  Result Value Ref Range   Vitamin B-12 318 232 - 1,245 pg/mL  Folate     Status: None   Collection Time: 12/04/23  3:33 PM  Result Value Ref  Range   Folate >20.0 >3.0 ng/mL    Comment: A serum folate concentration of less than 3.1 ng/mL is considered to represent clinical deficiency.   Iron  and TIBC     Status: Abnormal   Collection Time: 12/04/23  3:33 PM  Result Value Ref Range   Total Iron  Binding Capacity 493 (H) 250 - 450 ug/dL   UIBC 564 (H) 868 - 574 ug/dL   Iron  58 27 - 159 ug/dL   Iron  Saturation 12 (L) 15 - 55 %  Ferritin     Status: Abnormal   Collection Time: 12/04/23  3:33 PM  Result Value Ref Range   Ferritin 12 (L) 15 - 150 ng/mL  CBC with Differential/Platelet     Status: Abnormal   Collection Time: 12/04/23  3:39 PM  Result Value Ref Range   WBC 9.2 3.4 - 10.8 x10E3/uL   RBC 4.97 3.77 - 5.28 x10E6/uL   Hemoglobin 12.4 11.1 - 15.9 g/dL   Hematocrit 60.0 65.9 - 46.6 %   MCV 80 79 - 97 fL   MCH 24.9 (L) 26.6 - 33.0 pg   MCHC 31.1 (L) 31.5 - 35.7 g/dL   RDW 84.5 88.2 - 84.5 %   Platelets 293 150 - 450 x10E3/uL   Neutrophils 65 Not Estab. %   Lymphs 27 Not Estab. %   Monocytes 5 Not Estab. %   Eos 2 Not Estab. %   Basos 1 Not Estab. %   Neutrophils Absolute 6.0 1.4 - 7.0 x10E3/uL   Lymphocytes Absolute 2.5 0.7 - 3.1 x10E3/uL   Monocytes Absolute 0.5 0.1 - 0.9 x10E3/uL   EOS (ABSOLUTE) 0.1 0.0 - 0.4 x10E3/uL   Basophils Absolute 0.1 0.0 - 0.2 x10E3/uL   Immature Granulocytes 0 Not Estab. %   Immature Grans (Abs) 0.0 0.0 - 0.1 x10E3/uL  Cytology - PAP( [place])     Status: None   Collection Time: 12/06/23 10:59 AM  Result Value Ref Range   Adequacy      Satisfactory for evaluation; transformation zone component PRESENT.   Diagnosis      - Negative for intraepithelial lesion or malignancy (NILM)  POCT urine pregnancy     Status: Normal   Collection Time: 12/25/23  4:50 PM  Result Value Ref Range   Preg Test, Ur Negative Negative  Cervicovaginal ancillary only     Status: None   Collection Time: 12/25/23  4:52 PM  Result Value Ref Range   Bacterial Vaginitis (gardnerella) Negative     Candida Vaginitis Negative    Candida Glabrata Negative    Comment      Normal Reference Range Bacterial Vaginosis - Negative   Comment Normal Reference Range Candida Species - Negative    Comment Normal Reference Range Candida Galbrata - Negative        Assessment & Plan:   Assessment & Plan Dyslipidemia - Reinforced healthy diet and exercise as tolerated. Iron  deficiency - Start accufer iron  supplement daily.  Positive ANA (antinuclear antibody) Eczema, unspecified type Mild intermittent asthma without complication -  Rheumatology referral sent - Positive blood allergy testing; discussed future referral to allergist. Keep skin clean and dry and avoid exposure to allergens. - Continue medications as prescribed. Class 1 obesity due to excess calories with serious comorbidity and body mass index (BMI) of 31.0 to 31.9 in adult     No follow-ups on file.   Total time spent: 25 minutes  Oddis DELENA Cain, FNP  01/08/2024   This document may have been prepared by [facility] Voice Recognition software and as such may include unintentional dictation errors.     [1]  Allergies Allergen Reactions   Cat Dander Itching    Other reaction(s): Sneezing Watery eyes   Dog Fennel Allergy Skin Test    Dust Mite Extract    Nitrofurantoin Nausea Only   Sulfa  Antibiotics Other (See Comments)    Kidney pain and seasonal allergy symptoms   Latex Rash   "
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
## 2024-01-21 ENCOUNTER — Ambulatory Visit

## 2024-01-21 VITALS — BP 101/63 | HR 75 | Temp 98.4°F | Resp 13 | Ht 67.5 in | Wt 202.0 lb

## 2024-01-21 DIAGNOSIS — R7689 Other specified abnormal immunological findings in serum: Secondary | ICD-10-CM | POA: Insufficient documentation

## 2024-01-21 DIAGNOSIS — Z79899 Other long term (current) drug therapy: Secondary | ICD-10-CM | POA: Diagnosis present

## 2024-01-21 DIAGNOSIS — M3500 Sicca syndrome, unspecified: Secondary | ICD-10-CM | POA: Insufficient documentation

## 2024-01-21 MED ORDER — HYDROXYCHLOROQUINE SULFATE 200 MG PO TABS: 200.0000 mg | ORAL_TABLET | Freq: Two times a day (BID) | ORAL | 1 refills | Status: AC
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
## 2024-01-22 ENCOUNTER — Ambulatory Visit: Payer: Self-pay

## 2024-01-22 ENCOUNTER — Other Ambulatory Visit: Payer: Self-pay | Admitting: Certified Nurse Midwife

## 2024-01-22 ENCOUNTER — Encounter: Payer: Self-pay | Admitting: Family

## 2024-01-22 DIAGNOSIS — F410 Panic disorder [episodic paroxysmal anxiety] without agoraphobia: Secondary | ICD-10-CM

## 2024-01-22 DIAGNOSIS — F419 Anxiety disorder, unspecified: Secondary | ICD-10-CM

## 2024-01-22 DIAGNOSIS — F988 Other specified behavioral and emotional disorders with onset usually occurring in childhood and adolescence: Secondary | ICD-10-CM

## 2024-01-22 MED ORDER — HYDROXYZINE HCL 10 MG PO TABS: 10.0000 mg | ORAL_TABLET | Freq: Three times a day (TID) | ORAL | 0 refills | Status: AC | PRN

## 2024-01-22 NOTE — Patient Instructions (Signed)
 [facility] [address] [place], [place] [address] [phone] (main office) [phone] (Deleon Passe's office)  /Emotional Wellbeing Apps and Websites Here are a few free apps meant to help you to help yourself.  To find, try searching on the internet to see if the app is offered on Apple/Android devices. If your first choice doesn't come up on your device, the good news is that there are many choices! Play around with different apps to see which ones are helpful to you.    Calm This is an app meant to help increase calm feelings. Includes info, strategies, and tools for tracking your feelings.      Calm Harm  This app is meant to help with self-harm. Provides many 5-minute or 15-min coping strategies for doing instead of hurting yourself.       Healthy Minds Health Minds is a problem-solving tool to help deal with emotions and cope with stress you encounter wherever you are.      MindShift This app can help people cope with anxiety. Rather than trying to avoid anxiety, you can make an important shift and face it.      MY3  MY3 features a support system, safety plan and resources with the goal of offering a tool to use in a time of need.       My Life My Voice  This mood journal offers a simple solution for tracking your thoughts, feelings and moods. Animated emoticons can help identify your mood.       Relax Melodies Designed to help with sleep, on this app you can mix sounds and meditations for relaxation.      Smiling Mind Smiling Mind is meditation made easy: it's a simple tool that helps put a smile on your mind.        Stop, Breathe & Think  A friendly, simple guide for people through meditations for mindfulness and compassion.  Stop, Breathe and Think Kids Enter your current feelings and choose a "mission" to help you cope. Offers videos for certain moods instead of just sound recordings.       Team  Orange The goal of this tool is to help teens change how they think, act, and react. This app helps you focus on your own good feelings and experiences.      The [employer] Box The [employer] Box (VHB) contains simple tools to help patients with coping, relaxation, distraction, and positive thinking.

## 2024-01-24 NOTE — BH Specialist Note (Signed)
 "   Integrated Behavioral Health via Telemedicine Visit  02/05/2024 Cindy Matthews [id number]  Number of Integrated Behavioral Health Clinician visits: 2- Second Visit  Session Start time: [phone]   Session End time: 1032  Total time in minutes: 45   Referring Provider: Delilah Cedar, CNM Patient/Family location: Home Cindy Matthews Provider location: [facility]  All persons participating in visit: Patient Cindy Matthews and [facility] Cindy Matthews   Types of Service: Individual psychotherapy and Video visit  I connected with Cindy Matthews and/or Cindy Matthews's n/a via  Telephone or Engineer, Civil (consulting)  (Video is Surveyor, mining) and verified that I am speaking with the correct person using two identifiers. Discussed confidentiality: Yes   I discussed the limitations of telemedicine and the avaiability of in person appointments.  Discussed there is a possibility of technology failure and discussed alternative modes of communication if that failure occurs.  I discussed that engaging in this telemedicine visit, they consent to the provision of behavioral healthcare and the services will be billed under their insurance.  Patient and/or legal guardian expressed understanding and consented to Telemedicine visit: Yes   Presenting Concerns: Patient and/or family reports the following symptoms/concerns: Considerably anxious and increased anger over need to have her wisdom teeth taken out; worries that Medicaid may not allow her to be knocked out during the procedure; after taking 2 vistaril /hydroxyzine  before previous dental procedure, helped reduce some anxiety, but not enough; felt panicky and a strong urge to escape/run out. Pt realized afterwards that she had bad dental experiences as a teen; didn't return to the dentist for a decade afterwards due to trauma. Pt is requesting refill on hydroxyzine  before she loses Medicaid.  Pt is still battling identity theft, which may impact her ability to remain on Medicaid, causing more anxiety that she could be uninsured and not be able to receive ongoing care for recently diagnosed Sjogren's syndrome; mixed emotions regarding taking medication to treat autoimmune condition due to possible side effects.  Duration of problem: Ongoing; Severity of problem: moderately severe  Patient and/or Family's Strengths/Protective Factors: Concrete supports in place (healthy food, safe environments, etc.) and Sense of purpose  Goals Addressed: Patient will:  Reduce symptoms of: anxiety, mood instability, and stress   Increase knowledge and/or ability of: stress reduction   Demonstrate ability to: Increase healthy adjustment to current life circumstances and Increase motivation to adhere to plan of care  Progress towards Goals: Ongoing    Interventions: Interventions utilized:  Motivational Interviewing, Solution-Focused Strategies, and Supportive Reflection Standardized Assessments completed: Not Needed    Patient and/or Family Response:Patient agrees with treatment plan.   Clinical Assessment/Diagnosis  Panic disorder without agoraphobia  Attention deficit disorder, unspecified type   Patient may benefit from continued therapeutic intervention   Plan: Follow up with behavioral health clinician on : One month; Call Cindy Matthews at [phone], as needed. Behavioral recommendations:  -Continue taking hydroxyzine  as prescribed; as soon as able, discuss with PCP about ongoing prescribing this medication -Continue working with Medicaid and social services regarding recommended steps to prove identify, identity theft, and need for ongoing Medicaid -Continue following medical specialist recommendations; discuss pain options with dental provider -Continue to consider referral to psychiatry in the future  -Continue relaxation breathing exercises daily as needed Referral(s):  [facility] [employer] (In Clinic)  I discussed the assessment and treatment plan with the patient and/or parent/guardian. They were provided an opportunity to ask questions and  all were answered. They agreed with the plan and demonstrated an understanding of the instructions.   They were advised to call back or seek an in-person evaluation if the symptoms worsen or if the condition fails to improve as anticipated.  Cindy JAYSON Mering, LCSW     12/04/2023    2:37 PM 11/14/2023    3:58 PM 09/10/2022    2:22 PM 06/19/2022    2:57 PM 05/21/2022    3:15 PM  Depression screen PHQ 2/9  Decreased Interest 1 2 2  0 0  Down, Depressed, Hopeless 0 2 2 0 0  PHQ - 2 Score 1 4 4  0 0  Altered sleeping 1 1 2  0 0  Tired, decreased energy 2 3 2 1 2   Change in appetite 2 2 2  0 1  Feeling bad or failure about yourself  0 0 2 0 0  Trouble concentrating 3 3 2 1 3   Moving slowly or fidgety/restless 0 0 2 0 0  Suicidal thoughts 0 0 0 0 0  PHQ-9 Score 9 13  16  2  6    Difficult doing work/chores  Not difficult at all Somewhat difficult Not difficult at all Not difficult at all     Data saved with a previous flowsheet row definition      12/04/2023    2:38 PM 11/14/2023    3:58 PM 09/10/2022    2:22 PM 05/21/2022    3:16 PM  GAD 7 : Generalized Anxiety Score  Nervous, Anxious, on Edge 3  2  2  2    Control/stop worrying 3  2  2  3    Worry too much - different things 3  2  2  2    Trouble relaxing 1  2  2   0   Restless 0  0  2  0   Easily annoyed or irritable 2  3  2  1    Afraid - awful might happen 1  3  2   0   Total GAD 7 Score 13 14 14 8   Anxiety Difficulty  Not difficult at all Somewhat difficult Not difficult at all     Data saved with a previous flowsheet row definition     "

## 2024-01-25 LAB — SEDIMENTATION RATE: Sed Rate: 19 mm/h (ref 0–20)

## 2024-01-25 LAB — ALT: ALT: 22 U/L (ref 6–29)

## 2024-01-25 LAB — PROTEIN ELECTROPHORESIS, SERUM
Albumin ELP: 4.7 g/dL (ref 3.8–4.8)
Alpha 1: 0.3 g/dL (ref 0.2–0.3)
Alpha 2: 0.8 g/dL (ref 0.5–0.9)
Beta 2: 0.4 g/dL (ref 0.2–0.5)
Beta Globulin: 0.6 g/dL (ref 0.4–0.6)
Gamma Globulin: 1.1 g/dL (ref 0.8–1.7)
Total Protein: 7.9 g/dL (ref 6.1–8.1)

## 2024-01-25 LAB — CBC
HCT: 38.3 % (ref 35.9–46.0)
Hemoglobin: 11.9 g/dL (ref 11.7–15.5)
MCH: 25.1 pg — ABNORMAL LOW (ref 27.0–33.0)
MCHC: 31.1 g/dL — ABNORMAL LOW (ref 31.6–35.4)
MCV: 80.6 fL — ABNORMAL LOW (ref 81.4–101.7)
MPV: 11.9 fL (ref 7.5–12.5)
Platelets: 320 Thousand/uL (ref 140–400)
RBC: 4.75 Million/uL (ref 3.80–5.10)
RDW: 15.5 % — ABNORMAL HIGH (ref 11.0–15.0)
WBC: 7.9 Thousand/uL (ref 3.8–10.8)

## 2024-01-25 LAB — AST: AST: 18 U/L (ref 10–30)

## 2024-01-25 LAB — CREATININE, SERUM: Creat: 0.7 mg/dL (ref 0.50–0.96)

## 2024-01-25 LAB — KAPPA/LAMBDA LIGHT CHAINS
Kappa free light chain: 15.2 mg/L (ref 3.3–19.4)
Kappa:Lambda Ratio: 0.68 (ref 0.26–1.65)
Lambda Free Lght Chn: 22.5 mg/L (ref 5.7–26.3)

## 2024-01-25 LAB — IFE INTERPRETATION

## 2024-01-25 LAB — SJOGREN'S SYNDROME ANTIBODS(SSA + SSB)
SSA (Ro) (ENA) Antibody, IgG: <1 AI
SSB (La) (ENA) Antibody, IgG: 2.6 AI — AB

## 2024-01-25 LAB — C-REACTIVE PROTEIN: CRP: 14.6 mg/L — ABNORMAL HIGH

## 2024-01-31 NOTE — Telephone Encounter (Signed)
 See separate message

## 2024-02-03 ENCOUNTER — Telehealth: Payer: Self-pay | Admitting: Family

## 2024-02-03 NOTE — Telephone Encounter (Signed)
 Pt called requesting if you could upload the document in her mychart for social service, as she is not able to get transportation. States she also will need a physical copy for when she comes to her next appt. Pt stated she gotten a clinical cytogeneticist message stating this document would be ready to pickuip at lunchtime today

## 2024-02-05 ENCOUNTER — Ambulatory Visit: Admitting: Certified Nurse Midwife

## 2024-02-05 ENCOUNTER — Ambulatory Visit (INDEPENDENT_AMBULATORY_CARE_PROVIDER_SITE_OTHER): Payer: Self-pay | Admitting: Clinical

## 2024-02-05 DIAGNOSIS — F410 Panic disorder [episodic paroxysmal anxiety] without agoraphobia: Secondary | ICD-10-CM

## 2024-02-05 DIAGNOSIS — Z3009 Encounter for other general counseling and advice on contraception: Secondary | ICD-10-CM

## 2024-02-05 DIAGNOSIS — F988 Other specified behavioral and emotional disorders with onset usually occurring in childhood and adolescence: Secondary | ICD-10-CM

## 2024-02-11 ENCOUNTER — Ambulatory Visit: Payer: Self-pay

## 2024-02-19 ENCOUNTER — Ambulatory Visit

## 2024-02-25 ENCOUNTER — Ambulatory Visit: Admitting: Family

## 2024-05-20 ENCOUNTER — Ambulatory Visit

## 2024-07-08 ENCOUNTER — Ambulatory Visit: Admitting: Family

## 2024-10-01 ENCOUNTER — Encounter: Admitting: Physician Assistant
